# Patient Record
Sex: Female | Born: 1955 | Race: White | Hispanic: No | Smoking: Former smoker
Health system: Southern US, Community
[De-identification: ages and names within clinical notes are randomized; demographics above are authoritative.]

## PROBLEM LIST (undated history)

## (undated) DIAGNOSIS — N200 Calculus of kidney: Secondary | ICD-10-CM

## (undated) DIAGNOSIS — H9192 Unspecified hearing loss, left ear: Secondary | ICD-10-CM

## (undated) DIAGNOSIS — N189 Chronic kidney disease, unspecified: Secondary | ICD-10-CM

## (undated) DIAGNOSIS — K449 Diaphragmatic hernia without obstruction or gangrene: Secondary | ICD-10-CM

## (undated) DIAGNOSIS — R011 Cardiac murmur, unspecified: Secondary | ICD-10-CM

## (undated) DIAGNOSIS — J40 Bronchitis, not specified as acute or chronic: Secondary | ICD-10-CM

## (undated) DIAGNOSIS — R51 Headache: Secondary | ICD-10-CM

## (undated) HISTORY — PX: TUBAL LIGATION: SHX77

## (undated) HISTORY — PX: COLONOSCOPY: SHX174

## (undated) HISTORY — PX: DIAGNOSTIC LAPAROSCOPY: SUR761

## (undated) HISTORY — PX: CARDIAC VALVE SURGERY: SHX40

## (undated) HISTORY — PX: EXTRACORPOREAL SHOCK WAVE LITHOTRIPSY: SHX1557

## (undated) HISTORY — DX: Calculus of kidney: N20.0

## (undated) HISTORY — PX: UPPER GASTROINTESTINAL ENDOSCOPY: SHX188

## (undated) HISTORY — PX: OTHER SURGICAL HISTORY: SHX169

## (undated) HISTORY — PX: APPENDECTOMY: SHX54

---

## 2002-10-22 ENCOUNTER — Other Ambulatory Visit: Admission: RE | Admit: 2002-10-22 | Discharge: 2002-10-22 | Payer: Self-pay | Admitting: *Deleted

## 2011-07-10 HISTORY — PX: DILATION AND CURETTAGE OF UTERUS: SHX78

## 2011-07-30 NOTE — H&P (Signed)
NAME:  Lisa Patton, Lisa Patton NO.:  [id number]  MEDICAL RECORD NO.:  [id number]  LOCATION:                                 FACILITY:  PHYSICIAN:  Juluis Mire, M.D.   DATE OF BIRTH:  15-Jan-1956  DATE OF ADMISSION: DATE OF DISCHARGE:                             HISTORY & PHYSICAL   The patient is a 55 year old postmenopausal patient who presents for hysteroscopy.  The patient is referred to our office for management of symptomatic pelvic relaxation.  She reported episodes of postmenopausal bleeding. We did a saline infusion ultrasound revealing a large endometrial mass or polyp.  She now presents for hysteroscopic resection prior to proceeding with any further definitive surgery for pelvic relaxation.  In terms of allergies, she is allergic to CODEINE.  She is on no medications by her report.  PAST MEDICAL HISTORY:  Usual childhood diseases.  No significant sequelae.  She has had no surgical history.  She has had 2 vaginal deliveries.  SOCIAL HISTORY:  No tobacco or alcohol use.  FAMILY HISTORY:  Noncontributory.  REVIEW OF SYSTEMS:  Noncontributory.  PHYSICAL EXAMINATION:  GENERAL:  The patient is afebrile. VITAL SIGNS:  Stable vital signs. HEENT:  The patient is normocephalic.  Pupils are equal, round, and reactive to light and accommodation.  Extraocular movements are intact. Sclerae and conjunctivae are clear.  Oropharynx is clear. NECK:  Not examined. BREASTS:  Not examined. LUNGS:  Clear. CARDIOVASCULAR:  Regular rhythm and rate without murmurs or gallops. ABDOMEN:  Benign.  No mass, organomegaly, or tenderness. PELVIC:  Normal external genitalia.  Vaginal mucosa, a prominent cystocele with uterine descensus and associated rectocele.  Uterus normal size and shape.  Adnexa unremarkable. EXTREMITIES:  Trace edema, NEUROLOGIC:  Grossly within normal limits.  IMPRESSION:  Postmenopausal bleeding with large endometrial polyp.  PLAN:  The patient will  undergo hysteroscopic evaluation with resection of endometrial polyp.  The nature of procedure has been discussed and the risks explained.  Risks include a risk of infection.  Risk of hemorrhage that could require transfusion with the risk of AIDS or hepatitis.  Excessive bleeding could require hysterectomy.  There is a risk of perforation leading to injury to adjacent organs requiring further exploratory surgery.  Risk of deep venous thrombosis and pulmonary embolus.  The patient expressed understanding of the indications and risks.     Juluis Mire, M.D.     JSM/MEDQ  D:  07/30/2011  T:  07/30/2011  Job:  [id number]

## 2011-07-30 NOTE — H&P (Signed)
  Patient name Lisa Patton DICTATION# [id number] CSN# [id number]  Juluis Mire, MD 07/30/2011 8:38 AM

## 2011-08-02 ENCOUNTER — Encounter (HOSPITAL_COMMUNITY): Payer: Self-pay | Admitting: *Deleted

## 2011-08-02 ENCOUNTER — Ambulatory Visit (HOSPITAL_COMMUNITY)
Admission: RE | Admit: 2011-08-02 | Discharge: 2011-08-02 | Disposition: A | Discharge status: Home / Self Care | Payer: PRIVATE HEALTH INSURANCE | Source: Ambulatory Visit | Attending: Obstetrics and Gynecology | Admitting: Obstetrics and Gynecology

## 2011-08-02 ENCOUNTER — Ambulatory Visit (HOSPITAL_COMMUNITY): Payer: PRIVATE HEALTH INSURANCE | Admitting: Anesthesiology

## 2011-08-02 ENCOUNTER — Encounter (HOSPITAL_COMMUNITY): Payer: Self-pay | Admitting: Anesthesiology

## 2011-08-02 ENCOUNTER — Other Ambulatory Visit: Payer: Self-pay | Admitting: Obstetrics and Gynecology

## 2011-08-02 ENCOUNTER — Encounter (HOSPITAL_COMMUNITY)
Admission: RE | Disposition: A | Discharge status: Home / Self Care | Payer: Self-pay | Source: Ambulatory Visit | Attending: Obstetrics and Gynecology

## 2011-08-02 DIAGNOSIS — N840 Polyp of corpus uteri: Secondary | ICD-10-CM | POA: Insufficient documentation

## 2011-08-02 HISTORY — DX: Cardiac murmur, unspecified: R01.1

## 2011-08-02 HISTORY — DX: Headache: R51

## 2011-08-02 HISTORY — DX: Unspecified hearing loss, left ear: H91.92

## 2011-08-02 LAB — CBC
HCT: 46.9 % — ABNORMAL HIGH (ref 36.0–46.0)
MCV: 91.2 fL (ref 78.0–100.0)
RBC: 5.14 MIL/uL — ABNORMAL HIGH (ref 3.87–5.11)
RDW: 13.8 % (ref 11.5–15.5)
WBC: 6.2 10*3/uL (ref 4.0–10.5)

## 2011-08-02 SURGERY — DILATATION & CURETTAGE/HYSTEROSCOPY WITH RESECTOCOPE
Anesthesia: Choice

## 2011-08-02 MED ORDER — NAPROXEN 500 MG PO TABS: 500.0000 mg | ORAL_TABLET | Freq: Three times a day (TID) | ORAL | Status: DC | PRN

## 2011-08-02 MED ORDER — KETOROLAC TROMETHAMINE 30 MG/ML IJ SOLN
INTRAMUSCULAR | Status: DC | PRN
  Administered 2011-08-02: 15 mg via INTRAVENOUS

## 2011-08-02 MED ORDER — LIDOCAINE-EPINEPHRINE (PF) 1 %-1:200000 IJ SOLN
INTRAMUSCULAR | Status: DC | PRN
  Administered 2011-08-02: 10 mL

## 2011-08-02 MED ORDER — MIDAZOLAM HCL 5 MG/5ML IJ SOLN
INTRAMUSCULAR | Status: DC | PRN
  Administered 2011-08-02: 1 mg via INTRAVENOUS

## 2011-08-02 MED ORDER — FENTANYL CITRATE 0.05 MG/ML IJ SOLN: 25.0000 ug | INTRAMUSCULAR | Status: DC | PRN

## 2011-08-02 MED ORDER — FENTANYL CITRATE 0.05 MG/ML IJ SOLN
INTRAMUSCULAR | Status: DC | PRN
  Administered 2011-08-02: 50 ug via INTRAVENOUS

## 2011-08-02 MED ORDER — MIDAZOLAM HCL 2 MG/2ML IJ SOLN
INTRAMUSCULAR | Status: AC
  Filled 2011-08-02: qty 2

## 2011-08-02 MED ORDER — ONDANSETRON HCL 4 MG/2ML IJ SOLN
INTRAMUSCULAR | Status: AC
  Filled 2011-08-02: qty 2

## 2011-08-02 MED ORDER — PROPOFOL 10 MG/ML IV EMUL
INTRAVENOUS | Status: DC | PRN
  Administered 2011-08-02: 150 mg via INTRAVENOUS

## 2011-08-02 MED ORDER — DEXAMETHASONE SODIUM PHOSPHATE 10 MG/ML IJ SOLN
INTRAMUSCULAR | Status: AC
  Filled 2011-08-02: qty 1

## 2011-08-02 MED ORDER — EPHEDRINE 5 MG/ML INJ
INTRAVENOUS | Status: AC
  Filled 2011-08-02: qty 10

## 2011-08-02 MED ORDER — EPHEDRINE SULFATE 50 MG/ML IJ SOLN
INTRAMUSCULAR | Status: DC | PRN
  Administered 2011-08-02: 10 mg via INTRAVENOUS

## 2011-08-02 MED ORDER — PANTOPRAZOLE SODIUM 40 MG PO TBEC
DELAYED_RELEASE_TABLET | ORAL | Status: AC
  Administered 2011-08-02: 40 mg via ORAL
  Filled 2011-08-02: qty 1

## 2011-08-02 MED ORDER — DEXAMETHASONE SODIUM PHOSPHATE 10 MG/ML IJ SOLN
INTRAMUSCULAR | Status: DC | PRN
  Administered 2011-08-02: 10 mg via INTRAVENOUS

## 2011-08-02 MED ORDER — LIDOCAINE HCL (CARDIAC) 20 MG/ML IV SOLN
INTRAVENOUS | Status: DC | PRN
  Administered 2011-08-02: 50 mg via INTRAVENOUS

## 2011-08-02 MED ORDER — KETOROLAC TROMETHAMINE 60 MG/2ML IM SOLN
INTRAMUSCULAR | Status: DC | PRN
  Administered 2011-08-02: 15 mg via INTRAMUSCULAR

## 2011-08-02 MED ORDER — ONDANSETRON HCL 4 MG/2ML IJ SOLN
INTRAMUSCULAR | Status: DC | PRN
  Administered 2011-08-02: 4 mg via INTRAVENOUS

## 2011-08-02 MED ORDER — GLYCINE 1.5 % IR SOLN
Status: DC | PRN
  Administered 2011-08-02: 3000 mL

## 2011-08-02 MED ORDER — PROPOFOL 10 MG/ML IV EMUL
INTRAVENOUS | Status: AC
  Filled 2011-08-02: qty 20

## 2011-08-02 MED ORDER — LIDOCAINE HCL (CARDIAC) 20 MG/ML IV SOLN
INTRAVENOUS | Status: AC
  Filled 2011-08-02: qty 5

## 2011-08-02 MED ORDER — FENTANYL CITRATE 0.05 MG/ML IJ SOLN
INTRAMUSCULAR | Status: AC
  Filled 2011-08-02: qty 2

## 2011-08-02 MED ORDER — LACTATED RINGERS IV SOLN
INTRAVENOUS | Status: DC
  Administered 2011-08-02 (×2): via INTRAVENOUS

## 2011-08-02 MED ORDER — KETOROLAC TROMETHAMINE 30 MG/ML IJ SOLN
INTRAMUSCULAR | Status: AC
  Filled 2011-08-02: qty 1

## 2011-08-02 SURGICAL SUPPLY — 15 items
CANISTER SUCTION 2500CC (MISCELLANEOUS) ×2 IMPLANT
CATH ROBINSON RED A/P 16FR (CATHETERS) ×2 IMPLANT
CLOTH BEACON ORANGE TIMEOUT ST (SAFETY) ×2 IMPLANT
CONTAINER PREFILL 10% NBF 60ML (FORM) ×4 IMPLANT
DRAPE UTILITY XL STRL (DRAPES) ×4 IMPLANT
ELECT REM PT RETURN 9FT ADLT (ELECTROSURGICAL) ×2
ELECTRODE REM PT RTRN 9FT ADLT (ELECTROSURGICAL) ×1 IMPLANT
GLOVE BIO SURGEON STRL SZ7 (GLOVE) ×4 IMPLANT
GOWN PREVENTION PLUS LG XLONG (DISPOSABLE) ×2 IMPLANT
GOWN STRL REIN XL XLG (GOWN DISPOSABLE) ×2 IMPLANT
LOOP ANGLED CUTTING 22FR (CUTTING LOOP) ×2 IMPLANT
NEEDLE SPNL 20GX3.5 QUINCKE YW (NEEDLE) ×2 IMPLANT
PACK HYSTEROSCOPY LF (CUSTOM PROCEDURE TRAY) ×2 IMPLANT
TOWEL OR 17X24 6PK STRL BLUE (TOWEL DISPOSABLE) ×4 IMPLANT
WATER STERILE IRR 1000ML POUR (IV SOLUTION) ×2 IMPLANT

## 2011-08-02 NOTE — Anesthesia Postprocedure Evaluation (Signed)
  Anesthesia Post-op Note  Patient: Lisa Patton  Procedure(s) Performed:  DILATATION & CURETTAGE/HYSTEROSCOPY WITH RESECTOSCOPE  Patient Location: PACU  Anesthesia Type: General  Level of Consciousness: awake, alert  and oriented  Airway and Oxygen Therapy: Patient Spontanous Breathing  Post-op Pain: none  Post-op Assessment: Post-op Vital signs reviewed, Patient's Cardiovascular Status Stable, Respiratory Function Stable, Patent Airway and Pain level controlled  Post-op Vital Signs: Reviewed and stable  Complications: No apparent anesthesia complications

## 2011-08-02 NOTE — Progress Notes (Signed)
  Patient name  Lisa Patton DICTATION# [id number] CSN#  [id number]  Juluis Mire, MD 08/02/2011 8:04 AM

## 2011-08-02 NOTE — Anesthesia Procedure Notes (Signed)
Procedure Name: LMA Insertion Date/Time: 08/02/2011 7:38 AM Performed by: Cephus Shelling Pre-anesthesia Checklist: Patient identified, Patient being monitored, Emergency Drugs available and Suction available Patient Re-evaluated:Patient Re-evaluated prior to inductionOxygen Delivery Method: Circle System Utilized Preoxygenation: Pre-oxygenation with 100% oxygen Intubation Type: Combination inhalational/ intravenous induction Ventilation: Mask ventilation without difficulty LMA: LMA inserted LMA Size: 3.0 Number of attempts: 1 Placement Confirmation: positive ETCO2 and breath sounds checked- equal and bilateral Dental Injury: Teeth and Oropharynx as per pre-operative assessment

## 2011-08-02 NOTE — Brief Op Note (Signed)
08/02/2011  8:01 AM  PATIENT:  Lisa Patton  55 y.o. female  PRE-OPERATIVE DIAGNOSIS:  polyp  POST-OPERATIVE DIAGNOSIS:  polyp  PROCEDURE:  Procedure(s): DILATATION & CURETTAGE/HYSTEROSCOPY WITH RESECTOSCOPE  SURGEON:  Surgeon(s): Preesha Benjamin S Terik Haughey  PHYSICIAN ASSISTANT:   ASSISTANTS: none   ANESTHESIA:   general and paracervical block  OR FLUID I/O:  Total I/O In: 1000 [I.V.:1000] Out: 5 [Blood:5]  BLOOD ADMINISTERED:none  DRAINS: none   LOCAL MEDICATIONS USED:  XYLOCAINE 15CC  SPECIMEN:  Source of Specimen:  polyp and endometrial currettings  DISPOSITION OF SPECIMEN:  PATHOLOGY  COUNTS:  YES  TOURNIQUET:  * No tourniquets in log *  DICTATION: .Other Dictation: Dictation Number [id number]  PLAN OF CARE: Discharge to home after PACU  PATIENT DISPOSITION:  PACU - hemodynamically stable.   Delay start of Pharmacological VTE agent (>24hrs) due to surgical blood loss or risk of bleeding:  no

## 2011-08-02 NOTE — H&P (Signed)
Hand P unchanged

## 2011-08-02 NOTE — Transfer of Care (Signed)
Immediate Anesthesia Transfer of Care Note  Patient: Lisa Patton  Procedure(s) Performed:  DILATATION & CURETTAGE/HYSTEROSCOPY WITH RESECTOSCOPE  Patient Location: PACU  Anesthesia Type: General  Level of Consciousness: awake, sedated, patient cooperative and responds to stimulation  Airway & Oxygen Therapy: Patient Spontanous Breathing and Patient connected to nasal cannula oxygen  Post-op Assessment: Report given to PACU RN and Post -op Vital signs reviewed and stable  Post vital signs: Reviewed and stable  Complications: No apparent anesthesia complications

## 2011-08-02 NOTE — Anesthesia Preprocedure Evaluation (Addendum)
Anesthesia Evaluation  Name, MR# and DOB Patient awake  General Assessment Comment  Reviewed: Allergy & Precautions, H&P , NPO status , Patient's Chart, lab work & pertinent test results, reviewed documented beta blocker date and time   History of Anesthesia Complications Negative for: history of anesthetic complications  Airway Mallampati: II TM Distance: >3 FB Neck ROM: full    Dental  (+) Missing   Pulmonary  clear to auscultation  breath sounds clear to auscultation none    Cardiovascular Exercise Tolerance: Good regular Normal    Neuro/Psych   Headaches   Very HOH Negative Psych ROS  GI/Hepatic/Renal negative GI ROS  negative Liver ROS  negative Renal ROS        Endo/Other  Negative Endocrine ROS (+)      Abdominal   Musculoskeletal   Hematology negative hematology ROS (+)   Peds  Reproductive/Obstetrics    Anesthesia Other Findings 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           Anesthesia Physical Anesthesia Plan  ASA: II  Anesthesia Plan: General   Post-op Pain Management:    Induction:   Airway Management Planned:   Additional Equipment:   Intra-op Plan:   Post-operative Plan:   Informed Consent: I have reviewed the patients History and Physical, chart, labs and discussed the procedure including the risks, benefits and alternatives for the proposed anesthesia with the patient or authorized representative who has indicated his/her understanding and acceptance.     Plan Discussed with: CRNA and Surgeon  Anesthesia Plan Comments:         Anesthesia Quick Evaluation

## 2011-08-02 NOTE — Op Note (Signed)
[name], Lisa Patton  MEDICAL RECORD NO.:  [id number]  LOCATION:  [facility]                          FACILITY:  [facility]  PHYSICIAN:  Juluis Mire, M.D.   DATE OF BIRTH:  August 26, 1956  DATE OF PROCEDURE:  08/02/2011 DATE OF DISCHARGE:                              OPERATIVE REPORT   PREOPERATIVE DIAGNOSIS:  Endometrial polyp.  POSTOPERATIVE DIAGNOSIS:  Endometrial polyp.  PROCEDURE:  Paracervical block.  Cervical dilatation with hysteroscopy and resection of endometrial polyp.  Endometrial curettings.  SURGEON:  Juluis Mire, MD.  ANESTHESIA:  General with paracervical block.  ESTIMATED BLOOD LOSS:  Minimal.  PACKS AND DRAINS:  Were none.  INTRAOPERATIVE BLOOD REPLACEMENT:  None.  COMPLICATIONS:  None.  INDICATIONS:  Dictated in dictated history and physical.  PROCEDURE IN DETAIL:  The patient was taken to OR, placed in supine position.  After satisfactory level of general anesthesia was obtained, the patient was placed in the dorsal lithotomy position using the Allen stirrups.  Perineum and vagina prepped out with Betadine and draped sterile field.  Examination revealed marked uterine descensus with a severe cystocele.  Cervix was grasped with single-tooth tenaculum. Uterus sounded to approximately 9 cm.  Paracervical blocks was entered using 1% Xylocaine With Epinephrine.  Cervix was serially dilated to a size 31 Pratt dilator.  Hysteroscope was introduced and intrauterine cavity was distended using glycerol.  A large posterior wall polyp was identified and was resected and was sent for pathology.  Then the resection total deficit was 50 mL.  Endometrial curettings were then obtained and sent for pathological review.  Polyps had been incompletely resected.  There were no signs of perforation or complications.  There was no active bleeding.  At this point in time, the hysteroscope, single-tooth tenaculum and speculum were removed.  The  patient was taken out of the dorsal lithotomy position.  Once alert transferred to the recovery room in good condition.  Sponge, instrument, and needle count was correct per circulating nurse.     Juluis Mire, M.D.     JSM/MEDQ  D:  08/02/2011  T:  08/02/2011  Job:  [id number]

## 2011-10-02 ENCOUNTER — Encounter (HOSPITAL_COMMUNITY): Payer: Self-pay

## 2011-10-07 ENCOUNTER — Encounter (HOSPITAL_COMMUNITY)
Admission: RE | Admit: 2011-10-07 | Discharge: 2011-10-07 | Disposition: A | Discharge status: Home / Self Care | Payer: PRIVATE HEALTH INSURANCE | Source: Ambulatory Visit | Attending: Obstetrics and Gynecology | Admitting: Obstetrics and Gynecology

## 2011-10-07 ENCOUNTER — Other Ambulatory Visit: Payer: Self-pay

## 2011-10-07 ENCOUNTER — Encounter (HOSPITAL_COMMUNITY): Payer: Self-pay

## 2011-10-07 HISTORY — DX: Bronchitis, not specified as acute or chronic: J40

## 2011-10-07 HISTORY — DX: Diaphragmatic hernia without obstruction or gangrene: K44.9

## 2011-10-07 HISTORY — DX: Chronic kidney disease, unspecified: N18.9

## 2011-10-07 LAB — CBC
HCT: 42.1 % (ref 36.0–46.0)
MCHC: 33.3 g/dL (ref 30.0–36.0)
MCV: 90.7 fL (ref 78.0–100.0)
RDW: 13.6 % (ref 11.5–15.5)

## 2011-10-07 NOTE — Patient Instructions (Addendum)
   Your procedure is scheduled on:  Wednesday, Dec 5th  Enter through the [employer] [facility] at: 6:00am Pick up the phone at the desk and dial [phone] and inform us of your arrival.  Please call this number if you have any problems the morning of surgery: [phone]  Remember: Do not eat food after midnight: Tuesday Do not drink clear liquids after:  Tuesday Take these medicines the morning of surgery with a SIP OF WATER: None  Do not wear jewelry, make-up, or FINGER nail polish Do not wear lotions, powders, perfumes or deodorant. Do not shave 48 hours prior to surgery. Do not bring valuables to the hospital.  Leave suitcase in the car. After Surgery it may be brought to your room. For patients being admitted to the hospital, checkout time is 11:00am the day of discharge.  Home with husband  "Lisa Patton" or daughter Lisa Patton.  Remember to use your hibiclens as instructed.Please shower with 1/2 bottle the evening before your surgery and the other 1/2 bottle the morning of surgery.

## 2011-10-07 NOTE — Pre-Procedure Instructions (Signed)
OK to see patient DOS 

## 2011-10-08 NOTE — H&P (Signed)
  Patient name  Lisa Patton DICTATION#  [id number] CSN# [id number]  Juluis Mire, MD 10/08/2011 5:46 AM

## 2011-10-08 NOTE — H&P (Signed)
NAMEEMMACLAIRE, SWITALA NO.:  [id number]  MEDICAL RECORD NO.:  [id number]  LOCATION:  [facility]                           [facility]ACILITY:  [facility]  PHYSICIAN:  Juluis Mire, M.D.   DATE OF BIRTH:  1956-03-02  DATE OF ADMISSION:  10/07/2011 DATE OF DISCHARGE:  10/07/2011                             HISTORY & PHYSICAL   DATE OF SURGERY:  October 13, 2011.  This is done at [facility].  The patient is a 55 year old postmenopausal patient, who presents for laparoscopic-assisted vaginal hysterectomy, bilateral salpingo- oophorectomy, anterior and posterior repair, possible sacrospinous ligament suspension.  In relation to present admission, the patient was referred to our practice for evaluation of pelvic relaxation.  On evaluation, she had a bladder prolapsing out of the introitus along with moderate uterine descensus.  Bimanual exam was unremarkable.  She had reported some postmenopausal bleeding.  Underwent a saline infusion ultrasound with finding of endometrial polyp.  Subsequently had a hysteroscopic resection.  Polyp turned out to be benign.  Now presents for definitive surgery as noted above.  Other alternatives such as pessary use have been discussed.  We did do urodynamic testing in the office.  We could not demonstrate any significant leakage even with the use of pessary, therefore mid urethral sling will not be done.  In terms of allergies, allergic to CODEINE.  MEDICATIONS:  None.  PAST MEDICAL HISTORY:  Usual childhood diseases.  No significant sequelae.  No previous surgical history except for the above-noted hysteroscopy.  She has had 2 vaginal deliveries.  SOCIAL HISTORY:  No tobacco or alcohol use.  FAMILY HISTORY:  Noncontributory.  PHYSICAL EXAMINATION:  VITAL SIGNS:  The patient is afebrile with stable vital signs. HEENT:  The patient is normocephalic.  Pupils equal, round, and reactive to light and accommodation.  Extraocular movements were  intact.  Sclerae and conjunctivae are clear.  Oropharynx clear. NECK:  Without thyromegaly. BREASTS:  Not examined. LUNGS:  Clear. CARDIOVASCULAR SYSTEM:  Regular rhythm and rate without murmurs or gallops. ABDOMEN:  Benign.  No mass, organomegaly, or tenderness. PELVIC:  Normal external genitalia and vaginal mucosa.  Severe cystocele with associated uterine descensus.  Uterus normal size, shape, and contour.  Adnexa free of mass or tenderness. EXTREMITIES:  Trace edema. NEUROLOGIC:  Grossly within normal limits.  IMPRESSION:  Symptomatic pelvic relaxation.  PLAN:  The patient will undergo laparoscopic-assisted vaginal hysterectomy, bilateral salpingo-oophorectomy.  We will proceed with anterior and posterior repair with possible sacrospinous ligament suspension.  Risks of surgery have been discussed including the risk of infection.  Risk of hemorrhage that could require transfusion with the risk of AIDS or hepatitis.  Risk of injury to adjacent organs including bladder, bowel, ureters that could require further exploratory surgery. Risks of deep venous thrombosis and pulmonary embolus.  Potential pudendal nerve injury have been discussed.  Symptoms including buttocks or perineal pain and numbness have been described.  Possible removal of suture would be indicated at that point in time.  Recurrence rate for pelvic relaxation of 25% are quoted.  Other alternatives such as pessary explained.  The patient expressed understanding of the indications and risks.     Juluis Mire, M.D.  JSM/MEDQ  D:  10/08/2011  T:  10/08/2011  Job:  [id number]

## 2011-10-12 MED ORDER — CEFAZOLIN SODIUM-DEXTROSE 2-3 GM-% IV SOLR
2.0000 g | INTRAVENOUS | Status: AC
  Administered 2011-10-13: 2 g via INTRAVENOUS
  Filled 2011-10-12: qty 50

## 2011-10-13 ENCOUNTER — Ambulatory Visit (HOSPITAL_COMMUNITY)
Admission: RE | Admit: 2011-10-13 | Discharge: 2011-10-15 | Disposition: A | Discharge status: Home / Self Care | Payer: PRIVATE HEALTH INSURANCE | Source: Ambulatory Visit | Attending: Obstetrics and Gynecology | Admitting: Obstetrics and Gynecology

## 2011-10-13 ENCOUNTER — Other Ambulatory Visit: Payer: Self-pay | Admitting: Obstetrics and Gynecology

## 2011-10-13 ENCOUNTER — Encounter (HOSPITAL_COMMUNITY): Payer: Self-pay | Admitting: Anesthesiology

## 2011-10-13 ENCOUNTER — Encounter (HOSPITAL_COMMUNITY): Payer: Self-pay | Admitting: *Deleted

## 2011-10-13 ENCOUNTER — Encounter (HOSPITAL_COMMUNITY)
Admission: RE | Disposition: A | Discharge status: Home / Self Care | Payer: Self-pay | Source: Ambulatory Visit | Attending: Obstetrics and Gynecology

## 2011-10-13 ENCOUNTER — Ambulatory Visit (HOSPITAL_COMMUNITY): Payer: PRIVATE HEALTH INSURANCE | Admitting: Anesthesiology

## 2011-10-13 DIAGNOSIS — N8000 Endometriosis of the uterus, unspecified: Secondary | ICD-10-CM | POA: Insufficient documentation

## 2011-10-13 DIAGNOSIS — D251 Intramural leiomyoma of uterus: Secondary | ICD-10-CM | POA: Insufficient documentation

## 2011-10-13 DIAGNOSIS — Z90710 Acquired absence of both cervix and uterus: Secondary | ICD-10-CM

## 2011-10-13 DIAGNOSIS — N812 Incomplete uterovaginal prolapse: Secondary | ICD-10-CM | POA: Insufficient documentation

## 2011-10-13 DIAGNOSIS — Z01818 Encounter for other preprocedural examination: Secondary | ICD-10-CM | POA: Insufficient documentation

## 2011-10-13 DIAGNOSIS — Z01812 Encounter for preprocedural laboratory examination: Secondary | ICD-10-CM | POA: Insufficient documentation

## 2011-10-13 DIAGNOSIS — N9489 Other specified conditions associated with female genital organs and menstrual cycle: Secondary | ICD-10-CM | POA: Insufficient documentation

## 2011-10-13 DIAGNOSIS — N838 Other noninflammatory disorders of ovary, fallopian tube and broad ligament: Secondary | ICD-10-CM | POA: Insufficient documentation

## 2011-10-13 HISTORY — PX: CYSTOSCOPY: SHX5120

## 2011-10-13 HISTORY — PX: SALPINGOOPHORECTOMY: SHX82

## 2011-10-13 HISTORY — PX: LAPAROSCOPIC ASSISTED VAGINAL HYSTERECTOMY: SHX5398

## 2011-10-13 HISTORY — PX: ANTERIOR AND POSTERIOR REPAIR: SHX5121

## 2011-10-13 LAB — COMPREHENSIVE METABOLIC PANEL
CO2: 29 mEq/L (ref 19–32)
Calcium: 10 mg/dL (ref 8.4–10.5)
Creatinine, Ser: 0.78 mg/dL (ref 0.50–1.10)
GFR calc Af Amer: >90 mL/min (ref >90)
GFR calc non Af Amer: >90 mL/min (ref >90)
Glucose, Bld: 97 mg/dL (ref 70–99)

## 2011-10-13 SURGERY — HYSTERECTOMY, VAGINAL, LAPAROSCOPY-ASSISTED
Anesthesia: General | Site: Vagina | Wound class: Clean Contaminated

## 2011-10-13 MED ORDER — HYDROMORPHONE 0.3 MG/ML IV SOLN
INTRAVENOUS | Status: DC
  Administered 2011-10-13: 7.5 mg via INTRAVENOUS
  Administered 2011-10-13: 0.2 mg via INTRAVENOUS
  Administered 2011-10-13: 0.39 mg via INTRAVENOUS
  Administered 2011-10-13 – 2011-10-14 (×3): 0.2 mg via INTRAVENOUS
  Filled 2011-10-13: qty 25

## 2011-10-13 MED ORDER — FENTANYL CITRATE 0.05 MG/ML IJ SOLN
INTRAMUSCULAR | Status: AC
  Filled 2011-10-13: qty 5

## 2011-10-13 MED ORDER — HYDROMORPHONE HCL PF 1 MG/ML IJ SOLN: 0.2500 mg | INTRAMUSCULAR | Status: DC | PRN

## 2011-10-13 MED ORDER — GLYCOPYRROLATE 0.2 MG/ML IJ SOLN
INTRAMUSCULAR | Status: DC | PRN
  Administered 2011-10-13 (×2): 0.2 mg via INTRAVENOUS
  Administered 2011-10-13: 1 mg via INTRAVENOUS

## 2011-10-13 MED ORDER — FENTANYL CITRATE 0.05 MG/ML IJ SOLN
INTRAMUSCULAR | Status: DC | PRN
  Administered 2011-10-13 (×6): 50 ug via INTRAVENOUS

## 2011-10-13 MED ORDER — NEOSTIGMINE METHYLSULFATE 1 MG/ML IJ SOLN
INTRAMUSCULAR | Status: AC
  Filled 2011-10-13: qty 10

## 2011-10-13 MED ORDER — FENTANYL CITRATE 0.05 MG/ML IJ SOLN
INTRAMUSCULAR | Status: AC
  Administered 2011-10-13: 50 ug via INTRAVENOUS
  Filled 2011-10-13: qty 2

## 2011-10-13 MED ORDER — DEXAMETHASONE SODIUM PHOSPHATE 4 MG/ML IJ SOLN
INTRAMUSCULAR | Status: DC | PRN
  Administered 2011-10-13: 10 mg via INTRAVENOUS

## 2011-10-13 MED ORDER — ONDANSETRON HCL 4 MG/2ML IJ SOLN
INTRAMUSCULAR | Status: AC
  Filled 2011-10-13: qty 2

## 2011-10-13 MED ORDER — LIDOCAINE-EPINEPHRINE (PF) 1 %-1:200000 IJ SOLN
INTRAMUSCULAR | Status: DC | PRN
  Administered 2011-10-13: 30 mL

## 2011-10-13 MED ORDER — NEOSTIGMINE METHYLSULFATE 1 MG/ML IJ SOLN
INTRAMUSCULAR | Status: DC | PRN
  Administered 2011-10-13: 5 mg via INTRAVENOUS

## 2011-10-13 MED ORDER — LIDOCAINE HCL (CARDIAC) 20 MG/ML IV SOLN
INTRAVENOUS | Status: DC | PRN
  Administered 2011-10-13: 40 mg via INTRAVENOUS

## 2011-10-13 MED ORDER — LACTATED RINGERS IV SOLN
INTRAVENOUS | Status: DC
  Administered 2011-10-13 – 2011-10-14 (×4): via INTRAVENOUS

## 2011-10-13 MED ORDER — PROPOFOL 10 MG/ML IV EMUL
INTRAVENOUS | Status: AC
  Filled 2011-10-13: qty 20

## 2011-10-13 MED ORDER — KETOROLAC TROMETHAMINE 30 MG/ML IJ SOLN
INTRAMUSCULAR | Status: AC
  Filled 2011-10-13: qty 1

## 2011-10-13 MED ORDER — DIPHENHYDRAMINE HCL 50 MG/ML IJ SOLN: 12.5000 mg | Freq: Four times a day (QID) | INTRAMUSCULAR | Status: DC | PRN

## 2011-10-13 MED ORDER — DEXAMETHASONE SODIUM PHOSPHATE 10 MG/ML IJ SOLN
INTRAMUSCULAR | Status: AC
  Filled 2011-10-13: qty 1

## 2011-10-13 MED ORDER — INDIGOTINDISULFONATE SODIUM 8 MG/ML IJ SOLN
5.0000 mL | Freq: Once | INTRAMUSCULAR | Status: AC
  Administered 2011-10-13: 5 mL via INTRAVENOUS
  Filled 2011-10-13: qty 5

## 2011-10-13 MED ORDER — ROCURONIUM BROMIDE 100 MG/10ML IV SOLN
INTRAVENOUS | Status: DC | PRN
  Administered 2011-10-13: 30 mg via INTRAVENOUS
  Administered 2011-10-13: 10 mg via INTRAVENOUS

## 2011-10-13 MED ORDER — ONDANSETRON HCL 4 MG/2ML IJ SOLN
INTRAMUSCULAR | Status: DC | PRN
  Administered 2011-10-13: 4 mg via INTRAVENOUS

## 2011-10-13 MED ORDER — BUPIVACAINE HCL (PF) 0.25 % IJ SOLN
INTRAMUSCULAR | Status: DC | PRN
  Administered 2011-10-13: 8 mL

## 2011-10-13 MED ORDER — NALOXONE HCL 0.4 MG/ML IJ SOLN: 0.4000 mg | INTRAMUSCULAR | Status: DC | PRN

## 2011-10-13 MED ORDER — KETOROLAC TROMETHAMINE 30 MG/ML IJ SOLN
INTRAMUSCULAR | Status: DC | PRN
  Administered 2011-10-13: 30 mg via INTRAVENOUS

## 2011-10-13 MED ORDER — GLYCOPYRROLATE 0.2 MG/ML IJ SOLN
INTRAMUSCULAR | Status: AC
  Filled 2011-10-13: qty 1

## 2011-10-13 MED ORDER — PROPOFOL 10 MG/ML IV EMUL
INTRAVENOUS | Status: AC
  Filled 2011-10-13: qty 50

## 2011-10-13 MED ORDER — ROCURONIUM BROMIDE 50 MG/5ML IV SOLN
INTRAVENOUS | Status: AC
  Filled 2011-10-13: qty 1

## 2011-10-13 MED ORDER — STERILE WATER FOR IRRIGATION IR SOLN
Status: DC | PRN
  Administered 2011-10-13: 1000 mL

## 2011-10-13 MED ORDER — ONDANSETRON HCL 4 MG/2ML IJ SOLN
4.0000 mg | Freq: Four times a day (QID) | INTRAMUSCULAR | Status: DC | PRN
  Administered 2011-10-14: 4 mg via INTRAVENOUS
  Filled 2011-10-13: qty 2

## 2011-10-13 MED ORDER — PROPOFOL 10 MG/ML IV EMUL
INTRAVENOUS | Status: DC | PRN
  Administered 2011-10-13: 50 mg via INTRAVENOUS
  Administered 2011-10-13: 20 mg via INTRAVENOUS
  Administered 2011-10-13: 150 mg via INTRAVENOUS

## 2011-10-13 MED ORDER — MIDAZOLAM HCL 2 MG/2ML IJ SOLN
INTRAMUSCULAR | Status: AC
  Filled 2011-10-13: qty 2

## 2011-10-13 MED ORDER — MIDAZOLAM HCL 5 MG/5ML IJ SOLN
INTRAMUSCULAR | Status: DC | PRN
  Administered 2011-10-13: 2 mg via INTRAVENOUS

## 2011-10-13 MED ORDER — FENTANYL CITRATE 0.05 MG/ML IJ SOLN
25.0000 ug | INTRAMUSCULAR | Status: DC | PRN
  Administered 2011-10-13: 50 ug via INTRAVENOUS

## 2011-10-13 MED ORDER — SODIUM CHLORIDE 0.9 % IJ SOLN: 9.0000 mL | INTRAMUSCULAR | Status: DC | PRN

## 2011-10-13 MED ORDER — DIPHENHYDRAMINE HCL 12.5 MG/5ML PO ELIX: 12.5000 mg | ORAL_SOLUTION | Freq: Four times a day (QID) | ORAL | Status: DC | PRN

## 2011-10-13 MED ORDER — LIDOCAINE HCL (CARDIAC) 20 MG/ML IV SOLN
INTRAVENOUS | Status: AC
  Filled 2011-10-13: qty 5

## 2011-10-13 MED ORDER — GLYCOPYRROLATE 0.2 MG/ML IJ SOLN
INTRAMUSCULAR | Status: AC
  Filled 2011-10-13: qty 3

## 2011-10-13 SURGICAL SUPPLY — 52 items
BANDAGE GAUZE ELAST BULKY 4 IN (GAUZE/BANDAGES/DRESSINGS) IMPLANT
BLADE SURG 15 STRL LF C SS BP (BLADE) ×5 IMPLANT
BLADE SURG 15 STRL SS (BLADE) ×1
CANISTER SUCTION 2500CC (MISCELLANEOUS) ×6 IMPLANT
CATH ROBINSON RED A/P 16FR (CATHETERS) ×6 IMPLANT
CLOTH BEACON ORANGE TIMEOUT ST (SAFETY) ×6 IMPLANT
CONT PATH 16OZ SNAP LID 3702 (MISCELLANEOUS) ×6 IMPLANT
COVER TABLE BACK 60X90 (DRAPES) ×6 IMPLANT
DECANTER SPIKE VIAL GLASS SM (MISCELLANEOUS) ×6 IMPLANT
DERMABOND ADVANCED (GAUZE/BANDAGES/DRESSINGS) ×1
DERMABOND ADVANCED .7 DNX12 (GAUZE/BANDAGES/DRESSINGS) ×5 IMPLANT
DEVICE CAPIO SUTURING (INSTRUMENTS) ×1
DEVICE CAPIO SUTURING OPC (INSTRUMENTS) ×5 IMPLANT
DRAPE CAMERA CLOSED 9X96 (DRAPES) ×6 IMPLANT
ELECT REM PT RETURN 9FT ADLT (ELECTROSURGICAL) ×6
ELECTRODE REM PT RTRN 9FT ADLT (ELECTROSURGICAL) ×5 IMPLANT
GAUZE KERLIX 2  STERILE LF (GAUZE/BANDAGES/DRESSINGS) ×6 IMPLANT
GAUZE SPONGE 4X4 16PLY XRAY LF (GAUZE/BANDAGES/DRESSINGS) ×6 IMPLANT
GLOVE BIO SURGEON STRL SZ7 (GLOVE) ×18 IMPLANT
GLOVE BIOGEL PI IND STRL 6.5 (GLOVE) ×5 IMPLANT
GLOVE BIOGEL PI INDICATOR 6.5 (GLOVE) ×1
GOWN PREVENTION PLUS LG XLONG (DISPOSABLE) ×18 IMPLANT
GOWN STRL REIN 2XL XLG LVL4 (GOWN DISPOSABLE) ×12 IMPLANT
NEEDLE HYPO 22GX1.5 SAFETY (NEEDLE) ×6 IMPLANT
NEEDLE MAYO 6 CRC TAPER PT (NEEDLE) ×6 IMPLANT
NS IRRIG 1000ML POUR BTL (IV SOLUTION) ×6 IMPLANT
PACK LAVH (CUSTOM PROCEDURE TRAY) ×6 IMPLANT
SEALER TISSUE G2 CVD JAW 45CM (ENDOMECHANICALS) ×6 IMPLANT
SET CYSTO W/LG BORE CLAMP LF (SET/KITS/TRAYS/PACK) ×6 IMPLANT
SET IRRIG TUBING LAPAROSCOPIC (IRRIGATION / IRRIGATOR) ×6 IMPLANT
SUT CAPIO POLYGLYCOLIC (SUTURE) ×6 IMPLANT
SUT MON AB 2-0 CT1 27 (SUTURE) ×6 IMPLANT
SUT MON AB 3-0 SH 27 (SUTURE)
SUT MON AB 3-0 SH27 (SUTURE) IMPLANT
SUT VIC AB 0 CT1 18XCR BRD8 (SUTURE) ×10 IMPLANT
SUT VIC AB 0 CT1 27 (SUTURE) ×1
SUT VIC AB 0 CT1 27XBRD ANBCTR (SUTURE) ×5 IMPLANT
SUT VIC AB 0 CT1 36 (SUTURE) ×6 IMPLANT
SUT VIC AB 0 CT1 8-18 (SUTURE) ×2
SUT VIC AB 2-0 CT1 27 (SUTURE)
SUT VIC AB 2-0 CT1 TAPERPNT 27 (SUTURE) IMPLANT
SUT VIC AB 2-0 CT2 27 (SUTURE) ×24 IMPLANT
SUT VIC AB 2-0 UR6 27 (SUTURE) ×12 IMPLANT
SUT VICRYL 0 UR6 27IN ABS (SUTURE) ×6 IMPLANT
SUT VICRYL 1 TIES 12X18 (SUTURE) ×6 IMPLANT
SUT VICRYL 4-0 PS2 18IN ABS (SUTURE) ×6 IMPLANT
TOWEL OR 17X24 6PK STRL BLUE (TOWEL DISPOSABLE) ×18 IMPLANT
TRAY FOLEY CATH 14FR (SET/KITS/TRAYS/PACK) ×6 IMPLANT
TROCAR BALLN 12MMX100 BLUNT (TROCAR) ×6 IMPLANT
TROCAR Z-THREAD BLADED 5X100MM (TROCAR) ×6 IMPLANT
WARMER LAPAROSCOPE (MISCELLANEOUS) ×6 IMPLANT
WATER STERILE IRR 1000ML POUR (IV SOLUTION) ×6 IMPLANT

## 2011-10-13 NOTE — Brief Op Note (Signed)
10/13/2011  9:27 AM  PATIENT:  Lisa Patton  55 y.o. female  PRE-OPERATIVE DIAGNOSIS:  Pelvic Relaxation, Stress Urinary Incontinence  POST-OPERATIVE DIAGNOSIS:  Pelvic Relaxation, Stress Urinary Incontinence  PROCEDURE:  Procedure(s): LAPAROSCOPIC ASSISTED VAGINAL HYSTERECTOMY SALPINGO OOPHERECTOMY ANTERIOR (CYSTOCELE) AND POSTERIOR REPAIR (RECTOCELE) CYSTOSCOPY  SURGEON:  Surgeon(s): Satvik Parco S Rowe Warman Gretchen Adkins  PHYSICIAN ASSISTANT:   ASSISTANTS: atkins   ANESTHESIA:   general  EBL:  Total I/O In: 1000 [I.V.:1000] Out: 475 [Urine:350; Blood:125]  BLOOD ADMINISTERED:none  DRAINS: Urinary Catheter (Foley)   Vaginal pack  LOCAL MEDICATIONS USED:  XYLOCAINE 10CC  SPECIMEN:  Source of Specimen:  uterus tubes and ovaries  DISPOSITION OF SPECIMEN:  PATHOLOGY  COUNTS:  YES  TOURNIQUET:  * No tourniquets in log *  DICTATION: .Other Dictation: Dictation Number [id number]  PLAN OF CARE: Admit for overnight observation  PATIENT DISPOSITION:  PACU - hemodynamically stable.   Delay start of Pharmacological VTE agent (>24hrs) due to surgical blood loss or risk of bleeding:  {YES/NO/NOT APPLICABLE:20182

## 2011-10-13 NOTE — Progress Notes (Signed)
Patient ID: Lisa Patton, female   DOB: [birth year], 55 y.o.   MRN: [id number] Patient name DICTATION#  [id number] CSN# [id number]   Juluis Mire, MD 10/13/2011 9:29 AM

## 2011-10-13 NOTE — H&P (Signed)
  History and physical exam unchanged 

## 2011-10-13 NOTE — Anesthesia Preprocedure Evaluation (Signed)
Anesthesia Evaluation  Patient identified by MRN, date of birth, ID band Patient awake    Reviewed: Allergy & Precautions, H&P , NPO status , Patient's Chart, lab work & pertinent test results, reviewed documented beta blocker date and time   History of Anesthesia Complications Negative for: history of anesthetic complications  Airway Mallampati: II TM Distance: >3 FB Neck ROM: full    Dental  (+) Missing   Pulmonary neg pulmonary ROS,  clear to auscultation        Cardiovascular Exercise Tolerance: Good + Valvular Problems/Murmurs (history of open heart surgery at 55 yo to correct a valve problem) regular Normal    Neuro/Psych  Headaches, Very HOH Negative Psych ROS   GI/Hepatic negative GI ROS, Neg liver ROS,   Endo/Other  Negative Endocrine ROS  Renal/GU Renal disease (kidney stones)  Genitourinary negative   Musculoskeletal   Abdominal   Peds  Hematology negative hematology ROS (+)   Anesthesia Other Findings 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  Reproductive/Obstetrics                           Anesthesia Physical Anesthesia Plan  ASA: II  Anesthesia Plan: General ETT   Post-op Pain Management:    Induction:   Airway Management Planned:   Additional Equipment:   Intra-op Plan:   Post-operative Plan:   Informed Consent: I have reviewed the patients History and Physical, chart, labs and discussed the procedure including the risks, benefits and alternatives for the proposed anesthesia with the patient or authorized representative who has indicated his/her understanding and acceptance.   Dental Advisory Given  Plan Discussed with: CRNA and Surgeon  Anesthesia Plan Comments:         Anesthesia Quick Evaluation

## 2011-10-13 NOTE — Progress Notes (Signed)
Patient ID: Lisa Patton, female   DOB: [birth year], 55 y.o.   MRN: [id number] S: SOME ABD PAIN AND NAUSEA O: AF VSS      ABD SOFT    INC CLEAR  GOOD UO A: POST OP NORMAL DISCOMFORT P:  CONTINUE POST OP MANAGEMENT

## 2011-10-13 NOTE — Op Note (Signed)
NAMEHEYLI, MIN NO.:  [id number]  MEDICAL RECORD NO.:  [id number]  LOCATION:  [facility]                          FACILITY:  [facility]  PHYSICIAN:  Juluis Mire, M.D.   DATE OF BIRTH:  1956-11-06  DATE OF PROCEDURE:  10/13/2011 DATE OF DISCHARGE:                              OPERATIVE REPORT   PREOPERATIVE DIAGNOSIS:  Symptomatic pelvic relaxation.  POSTOPERATIVE DIAGNOSES:  Symptomatic pelvic relaxation.  OPERATIVE PROCEDURES:  Laparoscopic-assisted vaginal hysterectomy, bilateral salpingo-oophorectomy, anterior colporrhaphy.  Posterior colporrhaphy.  Sacrospinous ligament suspension.  Cystoscopy.  SURGEONS:  Juluis Mire, MD  ASSISTANT:  Zelphia Cairo, MD  ANESTHESIA:  General endotracheal.  ESTIMATED BLOOD LOSS:  200-300 mL.  PACKS:  Vaginal packs.  DRAINS:  Urethral Foley.  INTRAOPERATIVE BLOOD PLACED:  None.  COMPLICATIONS:  None.  INDICATION:  Dictated in the history and physical.  PROCEDURE:  The patient was taken to the OR and placed in the supine position.  After satisfactory level of general endotracheal anesthesia was obtained, the patient was placed in the dorsal lateral lithotomy position using the Allen stirrups.  The abdomen, perineum, and vagina were prepped out with Betadine.  Bladder was emptied out with catheterization.  Examination revealed a severe cystocele with associated uterine descensus.  A Hulka tenaculum was put in place and secured.  The patient was draped as a sterile field.  Subumbilical incision was made with a knife and carried through the subcutaneous tissue.  Fascia was entered sharply.  Incision was fashioned laterally. Peritoneum was entered sharply.  Open laparoscopic trocar was put in place and secured.  The abdomen was inflated with carbon dioxide. Laparoscope was introduced.  There was no evidence of injury to adjacent organs.  Upper abdomen including liver and tip of the gallbladder were clear.  Appendix  was surgically absent.  Uterus, tubes, and ovaries were unremarkable.  Cul-de-sac clear.  A 5-mm trocar was put in place in the suprapubic area under direct visualization.  Using the EnSeal, we first went to the right side.  The right ovarian vasculature was cauterized and incised.  The mesenteric attachments of the ovary and tube were cauterized and incised up to the round ligament.  The round ligament was then cauterized and incised.  We then went to the left side.  Left tube and ovary elevated.  The left ovarian vessels were cauterized and incised.  The mesenteric attachment of the ovary and tube were cauterized and incised up to the round ligament.  The round ligament was then cauterized and incised.  At this point in time, the abdomen was deflated of carbon dioxide. Laparoscope had been removed.  The patient's legs were repositioned. The Hulka tenaculum was then taken out.  Weighted speculum was placed in vaginal vault.  Cervix was grasped with Christella Hartigan tenaculum.  Cul-de-sac was entered sharply.  Both uterosacral ligaments were clamped, cut, and suture ligated with 0 Vicryl.  The reflection of vaginal mucosa anteriorly was incised.  The paracervical tissue was clamped, cut, and suture ligated with 0 Vicryl.  Vesicouterine space was identified entered sharply.  Retractor was put in place.  Using clamp, cut, and tie technique with suture ligature of 0 Vicryl, the parametrium  serially separated from sides of uterus.  Uterus was then flipped.  Remaining pedicles were clamped and cut.  Uterus, tubes, and ovaries were passed off the operative field and sent to Pathology.  Held pedicles were secured with free ties of 0 Vicryl.  Uterosacral plication stitch of 0 Vicryl was put in place.  We started reapproximating the vaginal mucosa starting posteriorly.  We went up just to the uterosacral ligaments.  At this point in time, we addressed the cystocele.  The vaginal mucosa was instilled with  1% Xylocaine with epinephrine.  We undermined the vaginal mucosa in the midline and incised up to approximately a centimeter below the urethral orifice.  We then dissected the paracervical tissue away from the overlying vaginal mucosa.  We reapproximated that in midline with interrupted sutures of 2-0 Vicryl, thus obliterating the cystocele.  Vaginal ends were trimmed and reapproximated in the midline with interrupted sutures of 2-0 Vicryl. We completed closure of the anterior vaginal cuff  with a 2-0 Vicryl.  Next, we went to the posterior repair.  The incision was made in the skin over the perineal body and it was dissected up to the vaginal opening and excised.  The underlined the vaginal mucosa in the midline, incised it to the midline up to approximately 2 cm from the vaginal cuff.  We dissected the perirectal fascia from the overlying vaginal mucosa.  Next, we identified the right sacrospinous ligament using Capio with a Vicryl suture.  It was passed through the ligament without difficulty and held.  We then using interrupted sutures of 2-0 Vicryl reapproximated the perirectal fascia in the midline thus obliterating the rectocele.  Using the free Mayo needle, the sacrospinous ligament stitch was secured to the vaginal apex on the right side.  We then trimmed the vaginal mucosa.  We then reapproximated in the midline with interrupted sutures of 2-0 Vicryl.  At this point in time, the sacrospinous ligament stitch was tied down with good elevation of the vaginal cuff.  Remaining vaginal mucosa was reapproximated with 2-0 Vicryl.  Peritoneal body was rebuilt with 2-0 Vicryl and skin over the peritoneum, closed with running subcuticular of 2-0 Vicryl.  Cystoscopy was then performed.  The patient had been given indigo carmine.  There was no evidence of injury to the bladder.  Blue-tinged urine were noted to be jetting from both ureteral orifices.  The cystoscope was then removed.  Foley  was placed to straight drain.  The. The abdomen was reinflated with carbon dioxide.  Laparoscope scope was reintroduced.  She did have some bruising at the vaginal cuff and brought under control with the bipolar.  Both sites of ovarian removal were hemostatically intact.  We deflated the abdomen, reinflated, did not see any further bleeding of venous oozing.  We irrigated to remove all irrigation.  At this point in time, the abdomen was deflated with carbon dioxide.  All trocars were removed.  Subumbilical fascia was closed with a figure-of-eight of 0 Vicryl.  Skin was closed with interrupted subcuticular 4-0 Vicryl.  Suprapubic incisions were closed with Dermabond.  The patient was taken out of dorsal position.  Once alert and extubated, transferred to the recovery room in good condition. Sponge, instrument, and needle counts were reported as correct by circulating nurse x2.  Of note, a vaginal pack was put in place.     Juluis Mire, M.D.     JSM/MEDQ  D:  10/13/2011  T:  10/13/2011  Job:  [id number]

## 2011-10-13 NOTE — Anesthesia Postprocedure Evaluation (Signed)
Anesthesia Post Note  Patient: Lisa Patton  Procedure(s) Performed:  LAPAROSCOPIC ASSISTED VAGINAL HYSTERECTOMY; SALPINGO OOPHERECTOMY; ANTERIOR (CYSTOCELE) AND POSTERIOR REPAIR (RECTOCELE) - With Sacrospinal Ligament Suspension; CYSTOSCOPY  Anesthesia type: General  Patient location: PACU  Post pain: Pain level controlled  Post assessment: Post-op Vital signs reviewed  Last Vitals:  Filed Vitals:   10/13/11 1000  BP: 133/44  Pulse: 58  Temp:   Resp: 16    Post vital signs: Reviewed  Level of consciousness: sedated  Complications: No apparent anesthesia complicationsfj

## 2011-10-13 NOTE — Transfer of Care (Signed)
Immediate Anesthesia Transfer of Care Note  Patient: Lisa Patton  Procedure(s) Performed:  LAPAROSCOPIC ASSISTED VAGINAL HYSTERECTOMY; SALPINGO OOPHERECTOMY; ANTERIOR (CYSTOCELE) AND POSTERIOR REPAIR (RECTOCELE) - With Sacrospinal Ligament Suspension; CYSTOSCOPY  Patient Location: PACU  Anesthesia Type: General  Level of Consciousness: awake and sedated  Airway & Oxygen Therapy: Patient Spontanous Breathing and Patient connected to nasal cannula oxygen  Post-op Assessment: Report given to PACU RN and Post -op Vital signs reviewed and stable  Post vital signs: Reviewed and stable  Complications: No apparent anesthesia complications

## 2011-10-13 NOTE — Anesthesia Procedure Notes (Signed)
Procedure Name: Intubation Date/Time: 10/13/2011 7:28 AM Performed by: Lincoln Brigham Pre-anesthesia Checklist: Patient identified, Patient being monitored, Emergency Drugs available, Timeout performed and Suction available Patient Re-evaluated:Patient Re-evaluated prior to inductionOxygen Delivery Method: Circle System Utilized Preoxygenation: Pre-oxygenation with 100% oxygen Intubation Type: IV induction Ventilation: Mask ventilation without difficulty Laryngoscope Size: Miller and 2 Grade View: Grade I Tube type: Oral Tube size: 7.0 mm Number of attempts: 1 Airway Equipment and Method: stylet Placement Confirmation: ETT inserted through vocal cords under direct vision,  breath sounds checked- equal and bilateral and positive ETCO2 Dental Injury: Teeth and Oropharynx as per pre-operative assessment

## 2011-10-14 LAB — CBC
RBC: 4.5 MIL/uL (ref 3.87–5.11)
RDW: 13.6 % (ref 11.5–15.5)
WBC: 12.1 10*3/uL — ABNORMAL HIGH (ref 4.0–10.5)

## 2011-10-14 MED ORDER — HYDROMORPHONE HCL 2 MG PO TABS
2.0000 mg | ORAL_TABLET | ORAL | Status: DC | PRN
  Administered 2011-10-14: 2 mg via ORAL
  Filled 2011-10-14: qty 1

## 2011-10-14 NOTE — Anesthesia Postprocedure Evaluation (Signed)
  Anesthesia Post-op Note  Patient: Lisa Patton  Procedure(s) Performed:  LAPAROSCOPIC ASSISTED VAGINAL HYSTERECTOMY; SALPINGO OOPHERECTOMY; ANTERIOR (CYSTOCELE) AND POSTERIOR REPAIR (RECTOCELE) - With Sacrospinal Ligament Suspension; CYSTOSCOPY  Patient Location: 318  Anesthesia Type: General  Level of Consciousness: awake, alert  and oriented  Airway and Oxygen Therapy: Patient Spontanous Breathing  Post-op Pain: mild  Post-op Assessment: Post-op Vital signs reviewed and Patient's Cardiovascular Status Stable  Post-op Vital Signs: Reviewed and stable  Complications: No apparent anesthesia complications

## 2011-10-14 NOTE — Progress Notes (Signed)
1 Day Post-Op Procedure(s): LAPAROSCOPIC ASSISTED VAGINAL HYSTERECTOMY SALPINGO OOPHERECTOMY ANTERIOR (CYSTOCELE) AND POSTERIOR REPAIR (RECTOCELE) CYSTOSCOPY  Subjective: Patient reports tolerating PO.    Objective: I have reviewed patient's vital signs.  Af vss  General: mild distress  abd soft postive bowel sounds Inc clear,  Vaginal pack removed no active bleeding Hgb 13.6   voide just 100 cc of 350 irrigation  Assessment: s/p Procedure(s): LAPAROSCOPIC ASSISTED VAGINAL HYSTERECTOMY SALPINGO OOPHERECTOMY ANTERIOR (CYSTOCELE) AND POSTERIOR REPAIR (RECTOCELE) CYSTOSCOPY: stable  Plan: Advance to PO medication  LOS: 1 day    Lisa Patton S 10/14/2011, 8:32 AM

## 2011-10-14 NOTE — Addendum Note (Signed)
Addendum  created 10/14/11 1629 by Karleen Dolphin   Modules edited:Notes Section

## 2011-10-14 NOTE — Progress Notes (Signed)
Patient ID: Lisa Patton, female   DOB: [birth year], 55 y.o.   MRN: [id number] S: CO NAUSEA AND VOMITTING O"  AF VSS        ABD SOFT POST FLATUS        VOIDING OK A:  POST OP NAUSES P:  FOLLOW HERE D/C IN AM JM

## 2011-10-15 NOTE — Progress Notes (Signed)
Pt discharged to home with husband.  Condition stable.  Pt to car via wheelchair with C. Clapp, NT.  No equipment for home ordered at discharge.

## 2011-10-15 NOTE — Discharge Summary (Signed)
  Patient name   Rondell Frick DICTATION#  [id number] CSN# [id number]  Juluis Mire, MD 10/15/2011 8:44 AM

## 2011-10-15 NOTE — Discharge Summary (Signed)
[name], Lisa Patton             ACCOUNT NO.:  [id number]  MEDICAL RECORD NO.:  [id number]  LOCATION:  9318                          FACILITY:  [facility]  PHYSICIAN:  Juluis Mire, M.D.   DATE OF BIRTH:  Jun 14, 1956  DATE OF ADMISSION:  10/13/2011 DATE OF DISCHARGE:  10/15/2011                              DISCHARGE SUMMARY   ADMITTING DIAGNOSIS:  Symptomatic pelvic relaxation.  DISCHARGE DIAGNOSIS:  Symptomatic pelvic relaxation.  PROCEDURE:  Laparoscopic-assisted vaginal hysterectomy with bilateral salpingo-oophorectomy.  Anterior and posterior colporrhaphy. Sacrospinous ligament suspension.  Cystoscopy.  For complete history and physical, please see dictated note.  COURSE IN THE HOSPITAL:  The patient underwent above-noted procedure. Postop hemoglobin was excellent.  She was observed during her first postop day due to nausea and vomiting.  The following day, she was afebrile with stable vital signs, and she was tolerating her diet.  She was voiding without difficulty.  Abdomen was soft.  Bowel sounds were active.  She was passing flatus.  All incisions were clear.  She had minimal vaginal bleeding.  In terms of complications, none were encountered during the stay in the hospital.  The patient discharged home in stable condition.  DISPOSITION:  The patient is to avoid heavy lifting, vaginal entrance, or driving a car.  She is to call with signs of infection, nausea, vomiting, active vaginal bleeding, excessive pain.  Also instructed of signs and symptoms of deep venous thrombosis and pulmonary embolus. Discharged home on Dilaudid as needed for pain.  Office will call her Monday and arrange a followup.     Juluis Mire, M.D.     JSM/MEDQ  D:  10/15/2011  T:  10/15/2011  Job:  [id number]

## 2011-10-15 NOTE — Progress Notes (Signed)
2 Days Post-Op Procedure(s): LAPAROSCOPIC ASSISTED VAGINAL HYSTERECTOMY SALPINGO OOPHERECTOMY ANTERIOR (CYSTOCELE) AND POSTERIOR REPAIR (RECTOCELE) CYSTOSCOPY  Subjective: Patient reports tolerating PO.    Objective: I have reviewed patient's vital signs. abd soft post flatus Inc clear Voiding ok Minimal bleedinmg  General: alert  Assessment: s/p Procedure(s): LAPAROSCOPIC ASSISTED VAGINAL HYSTERECTOMY SALPINGO OOPHERECTOMY ANTERIOR (CYSTOCELE) AND POSTERIOR REPAIR (RECTOCELE) CYSTOSCOPY: stable  Plan: Discharge home  LOS: 2 days    Lisa Patton S 10/15/2011, 8:41 AM

## 2011-10-18 ENCOUNTER — Encounter (HOSPITAL_COMMUNITY): Payer: Self-pay | Admitting: Obstetrics and Gynecology

## 2014-07-12 ENCOUNTER — Other Ambulatory Visit: Payer: Self-pay | Admitting: Otolaryngology

## 2014-07-12 DIAGNOSIS — H95199 Other disorders following mastoidectomy, unspecified ear: Secondary | ICD-10-CM

## 2015-11-21 ENCOUNTER — Encounter: Payer: PRIVATE HEALTH INSURANCE | Admitting: Vascular Surgery

## 2015-12-01 ENCOUNTER — Encounter: Payer: Self-pay | Admitting: Vascular Surgery

## 2015-12-10 ENCOUNTER — Encounter: Payer: PRIVATE HEALTH INSURANCE | Admitting: Vascular Surgery

## 2015-12-26 ENCOUNTER — Encounter: Payer: Self-pay | Admitting: Vascular Surgery

## 2015-12-31 ENCOUNTER — Encounter: Payer: Self-pay | Admitting: Vascular Surgery

## 2015-12-31 ENCOUNTER — Ambulatory Visit (INDEPENDENT_AMBULATORY_CARE_PROVIDER_SITE_OTHER): Payer: Commercial Managed Care - PPO | Admitting: Vascular Surgery

## 2015-12-31 VITALS — BP 160/72 | HR 79 | Temp 98.2°F | Ht <= 58 in | Wt 123.1 lb

## 2015-12-31 DIAGNOSIS — I6523 Occlusion and stenosis of bilateral carotid arteries: Secondary | ICD-10-CM | POA: Diagnosis not present

## 2015-12-31 NOTE — Progress Notes (Signed)
[facility]ist [facility]  Patient name: Mayson Gavin MRN: [id number] DOB: 1955/12/28 Sex: female  REASON FOR CONSULT: Ocular TIAs. Referred by Dr. Helene Kelp  HPI: Jaonna Sudar is a 60 y.o. female, who is [place] for carotid evaluation. She is right-handed. On my history, I do not get any history of stroke, TIAs, expressive or receptive aphasia, or amaurosis fugax. As described some dizziness when she's extending her arms above her head.  I have reviewed the records that were sent from Dr. Greggory Keen office. Her renal function is normal. I have also reviewed the note from the ophthalmologist's office. The patient had been seen with some decreased vision. She had been having some episodic tension-type headaches and was felt that she could potentially have ocular migraines versus TIAs.   Past Medical History  Diagnosis Date  . Headache(784.0)   . Heart murmur     pt states had surgery as child  . Hearing loss in left ear     pt has hearing aid in right ear.  . Bronchitis   . Chronic kidney disease     kidney stones x 5 episodes  . Hiatal hernia     no meds - diet controlled  . Kidney stones     No family history on file.  SOCIAL HISTORY: Social History   Social History  . Marital Status: Married    Spouse Name: N/A  . Number of Children: N/A  . Years of Education: N/A   Occupational History  . Not on file.   Social History Main Topics  . Smoking status: Former Smoker -- 1.00 packs/day for 7 years    Types: Cigarettes    Quit date: 09/08/1978  . Smokeless tobacco: Never Used  . Alcohol Use: No  . Drug Use: No  . Sexual Activity: Yes    Birth Control/ Protection: Surgical   Other Topics Concern  . Not on file   Social History Narrative    Allergies  Allergen Reactions  . Codeine Other (See Comments)    Makes pt feel "crazy" and "swimmy-headed"    Current Outpatient Prescriptions  Medication Sig Dispense Refill  . Atorvastatin Calcium (LIPITOR  PO) Take by mouth.     No current facility-administered medications for this visit.    REVIEW OF SYSTEMS:  [X]  denotes positive finding, [ ]  denotes negative finding Cardiac  Comments:  Chest pain or chest pressure:    Shortness of breath upon exertion:    Short of breath when lying flat:    Irregular heart rhythm:        Vascular    Pain in calf, thigh, or hip brought on by ambulation:    Pain in feet at night that wakes you up from your sleep:     Blood clot in your veins:    Leg swelling:         Pulmonary    Oxygen at home:    Productive cough:     Wheezing:         Neurologic    Sudden weakness in arms or legs:     Sudden numbness in arms or legs:     Sudden onset of difficulty speaking or slurred speech:    Temporary loss of vision in one eye:     Problems with dizziness:         Gastrointestinal    Blood in stool:     Vomited blood:         Genitourinary  Burning when urinating:     Blood in urine:        Psychiatric    Major depression:         Hematologic    Bleeding problems:    Problems with blood clotting too easily:        Skin    Rashes or ulcers:        Constitutional    Fever or chills:      PHYSICAL EXAM: Filed Vitals:   12/31/15 1331 12/31/15 1335  BP: 164/66 160/72  Pulse: 79   Temp: 98.2 F (36.8 C)   TempSrc: Oral   Height: 4\' 10"  (1.473 m)   Weight: 123 lb 1.6 oz (55.838 kg)   SpO2: 99%     GENERAL: The patient is a well-nourished female, in no acute distress. The vital signs are documented above. CARDIAC: There is a regular rate and rhythm.  VASCULAR: She has bilateral carotid bruits. She has palpable pedal pulses bilaterally. He has no significant lower extremity swelling. PULMONARY: There is good air exchange bilaterally without wheezing or rales. ABDOMEN: Soft and non-tender with normal pitched bowel sounds.  MUSCULOSKELETAL: There are no major deformities or cyanosis. NEUROLOGIC: No focal weakness or paresthesias  are detected. SKIN: There are no ulcers or rashes noted. PSYCHIATRIC: The patient has a normal affect.  DATA:   CAROTID DUPLEX: I have reviewed the carotid duplex scan that was done at [facility]. There was some mild plaque bilaterally but no significant stenosis was noted. There was a less than 50% stenosis bilaterally.  MEDICAL ISSUES:  MILD BILATERAL CAROTID DISEASE: This patient has only mild plaque bilaterally with no significant stenosis. I do not think that her ocular symptoms can be attributed to her mild carotid disease. I think from a vascular standpoint she is asymptomatic. I have recommended a follow duplex scan in 1 year and I'll see her back at that time. She is to call sooner she has problems. She is on aspirin and is on a statin. With respect to her dizziness, her vertebral arteries are patent and there is no evidence of vertebral basilar insufficiency.  HYPERTENSION: The patient's initial blood pressure today was elevated. We repeated this and this was still elevated. We have encouraged the patient to follow up with their primary care physician for management of their blood pressure.   Deitra Mayo [facility] of [place]: [phone]

## 2015-12-31 NOTE — Addendum Note (Signed)
Addended by: Dorthula Rue L on: 12/31/2015 02:36 PM   Modules accepted: Orders

## 2016-11-23 DIAGNOSIS — Z1231 Encounter for screening mammogram for malignant neoplasm of breast: Secondary | ICD-10-CM | POA: Diagnosis not present

## 2016-11-27 DIAGNOSIS — J189 Pneumonia, unspecified organism: Secondary | ICD-10-CM | POA: Diagnosis not present

## 2016-11-27 DIAGNOSIS — J209 Acute bronchitis, unspecified: Secondary | ICD-10-CM | POA: Diagnosis not present

## 2016-11-28 DIAGNOSIS — R05 Cough: Secondary | ICD-10-CM | POA: Diagnosis not present

## 2016-12-06 DIAGNOSIS — J189 Pneumonia, unspecified organism: Secondary | ICD-10-CM | POA: Diagnosis not present

## 2016-12-09 DIAGNOSIS — N281 Cyst of kidney, acquired: Secondary | ICD-10-CM | POA: Diagnosis not present

## 2016-12-09 DIAGNOSIS — J189 Pneumonia, unspecified organism: Secondary | ICD-10-CM | POA: Diagnosis not present

## 2016-12-29 ENCOUNTER — Encounter: Payer: Self-pay | Admitting: Vascular Surgery

## 2017-01-05 ENCOUNTER — Inpatient Hospital Stay (HOSPITAL_COMMUNITY): Admission: RE | Admit: 2017-01-05 | Payer: Commercial Managed Care - PPO | Source: Ambulatory Visit

## 2017-01-05 ENCOUNTER — Ambulatory Visit: Payer: Commercial Managed Care - PPO

## 2017-04-02 DIAGNOSIS — R079 Chest pain, unspecified: Secondary | ICD-10-CM | POA: Diagnosis not present

## 2017-04-02 DIAGNOSIS — S20212A Contusion of left front wall of thorax, initial encounter: Secondary | ICD-10-CM | POA: Diagnosis not present

## 2017-10-12 DIAGNOSIS — K921 Melena: Secondary | ICD-10-CM | POA: Diagnosis not present

## 2017-10-13 DIAGNOSIS — R195 Other fecal abnormalities: Secondary | ICD-10-CM | POA: Diagnosis not present

## 2017-10-26 DIAGNOSIS — I10 Essential (primary) hypertension: Secondary | ICD-10-CM | POA: Diagnosis not present

## 2017-11-02 DIAGNOSIS — R12 Heartburn: Secondary | ICD-10-CM | POA: Diagnosis not present

## 2017-11-02 DIAGNOSIS — K921 Melena: Secondary | ICD-10-CM | POA: Diagnosis not present

## 2017-11-02 DIAGNOSIS — R1310 Dysphagia, unspecified: Secondary | ICD-10-CM | POA: Diagnosis not present

## 2017-11-07 DIAGNOSIS — K921 Melena: Secondary | ICD-10-CM | POA: Diagnosis not present

## 2017-11-07 DIAGNOSIS — K3189 Other diseases of stomach and duodenum: Secondary | ICD-10-CM | POA: Diagnosis not present

## 2017-11-07 DIAGNOSIS — Z800 Family history of malignant neoplasm of digestive organs: Secondary | ICD-10-CM | POA: Diagnosis not present

## 2017-11-07 DIAGNOSIS — R1310 Dysphagia, unspecified: Secondary | ICD-10-CM | POA: Diagnosis not present

## 2017-11-07 DIAGNOSIS — K219 Gastro-esophageal reflux disease without esophagitis: Secondary | ICD-10-CM | POA: Diagnosis not present

## 2017-11-07 DIAGNOSIS — K625 Hemorrhage of anus and rectum: Secondary | ICD-10-CM | POA: Diagnosis not present

## 2017-11-07 DIAGNOSIS — K228 Other specified diseases of esophagus: Secondary | ICD-10-CM | POA: Diagnosis not present

## 2017-11-07 DIAGNOSIS — K2900 Acute gastritis without bleeding: Secondary | ICD-10-CM | POA: Diagnosis not present

## 2017-11-24 DIAGNOSIS — Z1231 Encounter for screening mammogram for malignant neoplasm of breast: Secondary | ICD-10-CM | POA: Diagnosis not present

## 2017-12-26 DIAGNOSIS — J209 Acute bronchitis, unspecified: Secondary | ICD-10-CM | POA: Diagnosis not present

## 2017-12-26 DIAGNOSIS — K047 Periapical abscess without sinus: Secondary | ICD-10-CM | POA: Diagnosis not present

## 2018-02-16 DIAGNOSIS — K5900 Constipation, unspecified: Secondary | ICD-10-CM | POA: Diagnosis not present

## 2018-02-16 DIAGNOSIS — R1030 Lower abdominal pain, unspecified: Secondary | ICD-10-CM | POA: Diagnosis not present

## 2018-02-28 DIAGNOSIS — H9201 Otalgia, right ear: Secondary | ICD-10-CM | POA: Diagnosis not present

## 2018-11-28 DIAGNOSIS — R509 Fever, unspecified: Secondary | ICD-10-CM | POA: Diagnosis not present

## 2018-11-28 DIAGNOSIS — R070 Pain in throat: Secondary | ICD-10-CM | POA: Diagnosis not present

## 2018-11-28 DIAGNOSIS — J020 Streptococcal pharyngitis: Secondary | ICD-10-CM | POA: Diagnosis not present

## 2019-01-29 DIAGNOSIS — N644 Mastodynia: Secondary | ICD-10-CM | POA: Diagnosis not present

## 2019-01-29 DIAGNOSIS — Z6828 Body mass index (BMI) 28.0-28.9, adult: Secondary | ICD-10-CM | POA: Diagnosis not present

## 2019-01-29 DIAGNOSIS — M25512 Pain in left shoulder: Secondary | ICD-10-CM | POA: Diagnosis not present

## 2019-02-12 DIAGNOSIS — N644 Mastodynia: Secondary | ICD-10-CM | POA: Diagnosis not present

## 2019-03-01 DIAGNOSIS — M25512 Pain in left shoulder: Secondary | ICD-10-CM | POA: Diagnosis not present

## 2022-02-03 ENCOUNTER — Ambulatory Visit
Admission: RE | Admit: 2022-02-03 | Discharge: 2022-02-03 | Disposition: A | Discharge status: Home / Self Care | Payer: Managed Care, Other (non HMO) | Source: Ambulatory Visit | Attending: Internal Medicine | Admitting: Internal Medicine

## 2022-02-03 ENCOUNTER — Other Ambulatory Visit: Payer: Self-pay | Admitting: Internal Medicine

## 2022-02-03 DIAGNOSIS — R1011 Right upper quadrant pain: Secondary | ICD-10-CM

## 2022-02-03 DIAGNOSIS — M549 Dorsalgia, unspecified: Secondary | ICD-10-CM

## 2022-02-11 ENCOUNTER — Ambulatory Visit
Admission: RE | Admit: 2022-02-11 | Discharge: 2022-02-11 | Disposition: A | Discharge status: Home / Self Care | Payer: Managed Care, Other (non HMO) | Source: Ambulatory Visit | Attending: Internal Medicine | Admitting: Internal Medicine

## 2022-02-11 ENCOUNTER — Other Ambulatory Visit: Payer: Self-pay | Admitting: Internal Medicine

## 2022-02-11 DIAGNOSIS — S32020A Wedge compression fracture of second lumbar vertebra, initial encounter for closed fracture: Secondary | ICD-10-CM

## 2022-02-11 DIAGNOSIS — R1011 Right upper quadrant pain: Secondary | ICD-10-CM

## 2022-03-03 ENCOUNTER — Other Ambulatory Visit: Payer: Managed Care, Other (non HMO)

## 2022-03-24 ENCOUNTER — Ambulatory Visit
Admission: RE | Admit: 2022-03-24 | Discharge: 2022-03-24 | Disposition: A | Discharge status: Home / Self Care | Payer: Commercial Managed Care - HMO | Source: Ambulatory Visit | Attending: Internal Medicine | Admitting: Internal Medicine

## 2022-03-24 DIAGNOSIS — S32020A Wedge compression fracture of second lumbar vertebra, initial encounter for closed fracture: Secondary | ICD-10-CM

## 2022-03-24 MED ORDER — GADOBENATE DIMEGLUMINE 529 MG/ML IV SOLN
14.0000 mL | Freq: Once | INTRAVENOUS | Status: AC | PRN
  Administered 2022-03-24: 14 mL via INTRAVENOUS

## 2022-04-14 ENCOUNTER — Other Ambulatory Visit: Payer: Self-pay | Admitting: Gastroenterology

## 2022-04-14 DIAGNOSIS — R1011 Right upper quadrant pain: Secondary | ICD-10-CM

## 2022-04-20 ENCOUNTER — Encounter (HOSPITAL_COMMUNITY): Payer: Self-pay

## 2022-04-20 ENCOUNTER — Other Ambulatory Visit: Payer: Self-pay

## 2022-04-20 ENCOUNTER — Emergency Department (HOSPITAL_COMMUNITY): Payer: Commercial Managed Care - HMO

## 2022-04-20 ENCOUNTER — Emergency Department (HOSPITAL_COMMUNITY)
Admission: EM | Admit: 2022-04-20 | Discharge: 2022-04-20 | Disposition: A | Discharge status: Home / Self Care | Payer: Commercial Managed Care - HMO | Attending: Emergency Medicine | Admitting: Emergency Medicine

## 2022-04-20 DIAGNOSIS — M545 Low back pain, unspecified: Secondary | ICD-10-CM | POA: Insufficient documentation

## 2022-04-20 DIAGNOSIS — D72829 Elevated white blood cell count, unspecified: Secondary | ICD-10-CM | POA: Insufficient documentation

## 2022-04-20 DIAGNOSIS — R109 Unspecified abdominal pain: Secondary | ICD-10-CM

## 2022-04-20 DIAGNOSIS — R1110 Vomiting, unspecified: Secondary | ICD-10-CM | POA: Diagnosis not present

## 2022-04-20 DIAGNOSIS — Z87442 Personal history of urinary calculi: Secondary | ICD-10-CM | POA: Diagnosis not present

## 2022-04-20 DIAGNOSIS — R1013 Epigastric pain: Secondary | ICD-10-CM | POA: Diagnosis not present

## 2022-04-20 DIAGNOSIS — R1031 Right lower quadrant pain: Secondary | ICD-10-CM | POA: Diagnosis present

## 2022-04-20 DIAGNOSIS — R197 Diarrhea, unspecified: Secondary | ICD-10-CM | POA: Insufficient documentation

## 2022-04-20 DIAGNOSIS — R1032 Left lower quadrant pain: Secondary | ICD-10-CM | POA: Diagnosis not present

## 2022-04-20 LAB — COMPREHENSIVE METABOLIC PANEL
ALT: 14 U/L (ref 0–44)
AST: 21 U/L (ref 15–41)
Albumin: 4.3 g/dL (ref 3.5–5.0)
Alkaline Phosphatase: 65 U/L (ref 38–126)
Anion gap: 10 (ref 5–15)
BUN: 21 mg/dL (ref 8–23)
CO2: 22 mmol/L (ref 22–32)
Calcium: 9.4 mg/dL (ref 8.9–10.3)
Chloride: 106 mmol/L (ref 98–111)
Creatinine, Ser: 0.71 mg/dL (ref 0.44–1.00)
GFR, Estimated: >60 mL/min (ref >60)
Glucose, Bld: 136 mg/dL — ABNORMAL HIGH (ref 70–99)
Potassium: 4.4 mmol/L (ref 3.5–5.1)
Sodium: 138 mmol/L (ref 135–145)
Total Bilirubin: 0.7 mg/dL (ref 0.3–1.2)
Total Protein: 8.2 g/dL — ABNORMAL HIGH (ref 6.5–8.1)

## 2022-04-20 LAB — CBC
HCT: 48.4 % — ABNORMAL HIGH (ref 36.0–46.0)
Hemoglobin: 16.1 g/dL — ABNORMAL HIGH (ref 12.0–15.0)
MCH: 29.8 pg (ref 26.0–34.0)
MCHC: 33.3 g/dL (ref 30.0–36.0)
MCV: 89.5 fL (ref 80.0–100.0)
Platelets: 237 10*3/uL (ref 150–400)
RBC: 5.41 MIL/uL — ABNORMAL HIGH (ref 3.87–5.11)
RDW: 13.2 % (ref 11.5–15.5)
WBC: 11 10*3/uL — ABNORMAL HIGH (ref 4.0–10.5)
nRBC: 0 % (ref 0.0–0.2)

## 2022-04-20 LAB — URINALYSIS, ROUTINE W REFLEX MICROSCOPIC
Bilirubin Urine: NEGATIVE
Glucose, UA: NEGATIVE mg/dL
Ketones, ur: NEGATIVE mg/dL
Nitrite: NEGATIVE
Protein, ur: NEGATIVE mg/dL
Specific Gravity, Urine: 1.008 (ref 1.005–1.030)
pH: 6 (ref 5.0–8.0)

## 2022-04-20 LAB — LIPASE, BLOOD: Lipase: 32 U/L (ref 11–51)

## 2022-04-20 MED ORDER — IOHEXOL 300 MG/ML  SOLN
80.0000 mL | Freq: Once | INTRAMUSCULAR | Status: AC | PRN
  Administered 2022-04-20: 80 mL via INTRAVENOUS

## 2022-04-20 MED ORDER — SODIUM CHLORIDE 0.9 % IV BOLUS
1000.0000 mL | Freq: Once | INTRAVENOUS | Status: AC
  Administered 2022-04-20: 1000 mL via INTRAVENOUS

## 2022-04-20 MED ORDER — FENTANYL CITRATE PF 50 MCG/ML IJ SOSY
50.0000 ug | PREFILLED_SYRINGE | Freq: Once | INTRAMUSCULAR | Status: AC
  Administered 2022-04-20: 50 ug via INTRAVENOUS
  Filled 2022-04-20: qty 1

## 2022-04-20 MED ORDER — OXYCODONE HCL 5 MG PO TABS: 5.0000 mg | ORAL_TABLET | ORAL | 0 refills | Status: AC | PRN

## 2022-04-20 MED ORDER — SUCRALFATE 1 GM/10ML PO SUSP: 1.0000 g | Freq: Three times a day (TID) | ORAL | 0 refills | Status: AC

## 2022-04-20 MED ORDER — PANTOPRAZOLE SODIUM 40 MG PO TBEC: 40.0000 mg | DELAYED_RELEASE_TABLET | Freq: Every day | ORAL | 0 refills | Status: AC

## 2022-04-20 NOTE — ED Provider Notes (Signed)
[facility] DEPT Provider Note   CSN: [id number] Arrival date & time: 04/20/22  0949     History  Chief Complaint  Patient presents with   Abdominal Pain   Emesis    Lisa Patton is a 66 y.o. female.   Abdominal Pain Associated symptoms: vomiting   Associated symptoms: no fever   Emesis Associated symptoms: abdominal pain   Associated symptoms: no fever     Patient presents with complaints of abdominal pain that started over the weekend.  Patient states she has been having pain in her upper abdomen radiating to her back.  She did have an episode of vomiting.  She also had an episode of loose stool.  Pain is severe and affecting her sleep.  Patient however also has back discomfort that is increased with movement and palpation.  She has had a tubal ligation and appendectomy and hysterectomy.  She does have history of hiatal hernia, kidney stones, hearing loss spinal stenosis, lumbar facet arthropathy and foraminal stenosis  Home Medications Prior to Admission medications   Medication Sig Start Date End Date Taking? Authorizing Provider  Atorvastatin Calcium (LIPITOR PO) Take by mouth.    [provider]      Allergies    Codeine    Review of Systems   Review of Systems  Constitutional:  Negative for fever.  Gastrointestinal:  Positive for abdominal pain and vomiting.    Physical Exam Updated Vital Signs BP (!) 158/94   Pulse 96   Temp 98 F (36.7 C) (Oral)   Resp 18   Ht 1.448 m ('4\' 9"'$ )   Wt 64 kg   LMP 09/30/2011   SpO2 98%   BMI 30.51 kg/m  Physical Exam Vitals and nursing note reviewed.  Constitutional:      General: She is not in acute distress.    Appearance: She is well-developed.  HENT:     Head: Normocephalic and atraumatic.     Right Ear: External ear normal.     Left Ear: External ear normal.  Eyes:     General: No scleral icterus.       Right eye: No discharge.        Left eye: No discharge.      Conjunctiva/sclera: Conjunctivae normal.  Neck:     Trachea: No tracheal deviation.  Cardiovascular:     Rate and Rhythm: Normal rate and regular rhythm.  Pulmonary:     Effort: Pulmonary effort is normal. No respiratory distress.     Breath sounds: Normal breath sounds. No stridor. No wheezing or rales.  Abdominal:     General: Bowel sounds are normal. There is no distension.     Palpations: Abdomen is soft.     Tenderness: There is abdominal tenderness in the right lower quadrant, epigastric area and left lower quadrant. There is no guarding or rebound.  Musculoskeletal:        General: No deformity.     Cervical back: Neck supple.     Lumbar back: Tenderness present. No edema, signs of trauma or spasms.  Skin:    General: Skin is warm and dry.     Findings: No rash.  Neurological:     General: No focal deficit present.     Mental Status: She is alert.     Cranial Nerves: No cranial nerve deficit (no facial droop, extraocular movements intact, no slurred speech).     Sensory: No sensory deficit.     Motor: No abnormal  muscle tone or seizure activity.     Coordination: Coordination normal.  Psychiatric:        Mood and Affect: Mood normal.     ED Results / Procedures / Treatments   Labs (all labs ordered are listed, but only abnormal results are displayed) Labs Reviewed  COMPREHENSIVE METABOLIC PANEL - Abnormal; Notable for the following components:      Result Value   Glucose, Bld 136 (*)    Total Protein 8.2 (*)    All other components within normal limits  CBC - Abnormal; Notable for the following components:   WBC 11.0 (*)    RBC 5.41 (*)    Hemoglobin 16.1 (*)    HCT 48.4 (*)    All other components within normal limits  URINALYSIS, ROUTINE W REFLEX MICROSCOPIC - Abnormal; Notable for the following components:   Hgb urine dipstick SMALL (*)    Leukocytes,Ua SMALL (*)    Bacteria, UA RARE (*)    All other components within normal limits  LIPASE, BLOOD     EKG None  Radiology No results found.  Procedures Procedures    Medications Ordered in ED Medications  iohexol (OMNIPAQUE) 300 MG/ML solution 80 mL (80 mLs Intravenous Contrast Given 04/20/22 1609)    ED Course/ Medical Decision Making/ A&P Clinical Course as of 04/20/22 1643  Tue Apr 20, 2022  1459 Comprehensive metabolic panel(!) Normal [JK]  1459 Urinalysis, Routine w reflex microscopic Urine, Clean Catch(!) Does not suggest infection or kidney stone  [JK]  1500 CBC(!) Leukocytosis. [JK]    Clinical Course User Index [JK] Dorie Rank, MD                           Medical Decision Making Differential diagnosis includes but not limited to pancreatitis, cholecystitis, diverticulitis, UTI, ureterolithiasis  Amount and/or Complexity of Data Reviewed Labs: ordered. Decision-making details documented in ED Course. Radiology: ordered.  Risk Prescription drug management.   Pt presents with acute abd pain back pain.  Labs do not show hepatitis, pancreatitis, uti.  Pt has some ttp in back.  Previous imaging test show DDD, facet arthropathy.  Pt given IV pain meds, fluids.  CT scan ordered.  Care turned over to Dr Regenia Skeeter.        Final Clinical Impression(s) / ED Diagnoses Final diagnoses:  Abdominal pain, unspecified abdominal location  Acute midline low back pain without sciatica    Rx / DC Orders ED Discharge Orders     None         Dorie Rank, MD 04/20/22 1643

## 2022-04-20 NOTE — ED Triage Notes (Addendum)
Patient c/o LUQ abdominal pain x 2 days and vomiting since last night. Patient states the pain radiates into the mid back. Patient states she has not had a normal BM in a week. Patient states that she had a small BM yesterday and a small amount of diarrhea 2 days before that.

## 2022-04-20 NOTE — ED Provider Notes (Signed)
5:13 PM Patient's CT viewed/interpreted.  There is some cholelithiasis but no obvious cholecystitis.  She is diffusely tender though worse in the upper abdomen for me and especially in the right upper quadrant.  I think is reasonable to get a right upper quadrant ultrasound.  She is also tachycardic to around 110 and we will give some fluids and pain control.  Right upper quadrant ultrasound is unremarkable.  At this point, no clear indication of why she is hurting.  Could be stomach pathology so we will give PPI and Carafate.  Seems stable for discharge home.  Follow-up with PCP.  Given return precautions   Sherwood Gambler, MD 04/20/22 [phone]

## 2022-04-20 NOTE — Discharge Instructions (Addendum)
If you develop worsening, continued, or recurrent abdominal pain, uncontrolled vomiting, fever, chest or back pain, or any other new/concerning symptoms then return to the ER for evaluation.  

## 2022-04-20 NOTE — ED Provider Triage Note (Signed)
Emergency Medicine Provider Triage Evaluation Note  Lisa Patton , a 66 y.o. female  was evaluated in triage.  Pt complains of abdominal pain.  Patient states she started having symptoms over the weekend.  She has not been able to sleep much because of the pain and discomfort in her abdomen that goes into her back.  She has had an episode of vomiting as well as loose stools.  She denies any dysuria the pain is severe.  Review of Systems  Positive: Abdominal pain Negative: Chest pain  Physical Exam  BP 121/67 (BP Location: Right Arm)   Pulse (!) 109   Temp 98 F (36.7 C) (Oral)   Resp 18   Ht 1.448 m ('4\' 9"'$ )   Wt 64 kg   LMP 09/30/2011   SpO2 100%   BMI 30.51 kg/m  Gen:   Awake, no distress   Resp:  Normal effort  MSK:   Moves extremities without difficulty  Other:  Tenderness to palpation in the abdomen diffusely  Medical Decision Making  Medically screening exam initiated at 10:25 AM.  Appropriate orders placed.  [name] was informed that the remainder of the evaluation will be completed by another provider, this initial triage assessment does not replace that evaluation, and the importance of remaining in the ED until their evaluation is complete.  We will proceed with abdominal pain evaluation.   Dorie Rank, MD 04/20/22 1026

## 2022-05-12 ENCOUNTER — Other Ambulatory Visit: Payer: Commercial Managed Care - HMO

## 2022-09-15 ENCOUNTER — Ambulatory Visit: Payer: Commercial Managed Care - HMO | Admitting: Obstetrics and Gynecology

## 2022-09-15 NOTE — Progress Notes (Deleted)
[facility] New Patient Evaluation and Consultation  Referring Provider: Christophe Louis, MD PCP: Ronita Hipps, MD Date of Service: 09/15/2022  SUBJECTIVE Chief Complaint: No chief complaint on file.  History of Present Illness: Lisa Patton is a 66 y.o. White or Caucasian female seen in consultation at the request of Dr. Landry Mellow for evaluation of prolapse and incontinence.    Review of records from Dr Landry Mellow significant for: Has mixed incontinence. Tried oxybutynin but had side effects and myrbetriq was too expensive. Also noted to have cystocele.  Urinary Symptoms: {urine leakage?:24754} Leaks *** time(s) per {days/wks/mos/yrs:310907}.  Pad use: {NUMBERS 1-10:18281} {pad option:24752} per day.   She {ACTION; IS/IS KGU:54270623} bothered by her UI symptoms.  Day time voids ***.  Nocturia: *** times per night to void. Voiding dysfunction: she {empties:24755} her bladder well.  {DOES NOT does:27190::"does not"} use a catheter to empty bladder.  When urinating, she feels {urine symptoms:24756} Drinks: *** per day  UTIs: {NUMBERS 1-10:18281} UTI's in the last year.   {ACTIONS;DENIES/REPORTS:21021675::"Denies"} history of {urologic concerns:24757}  Pelvic Organ Prolapse Symptoms:                  She {denies/ admits to:24761} a feeling of a bulge the vaginal area. It has been present for {NUMBER 1-10:22536} {days/wks/mos/yrs:310907}.  She {denies/ admits to:24761} seeing a bulge.  This bulge {ACTION; IS/IS JSE:83151761} bothersome.  Bowel Symptom: Bowel movements: *** time(s) per {Time; day/week/month:13537} Stool consistency: {stool consistency:24758} Straining: {yes/no:19897}.  Splinting: {yes/no:19897}.  Incomplete evacuation: {yes/no:19897}.  She {denies/ admits to:24761} accidental bowel leakage / fecal incontinence  Occurs: *** time(s) per {Time; day/week/month:13537}  Consistency with leakage: {stool consistency:24758} Bowel regimen: {bowel regimen:24759} Last  colonoscopy: Date ***, Results ***  Sexual Function Sexually active: {yes/no:19897}.  Sexual orientation: {Sexual Orientation:3104084146} Pain with sex: {pain with sex:24762}  Pelvic Pain {denies/ admits to:24761} pelvic pain Location: *** Pain occurs: *** Prior pain treatment: *** Improved by: *** Worsened by: ***   Past Medical History:  Past Medical History:  Diagnosis Date   Bronchitis    Chronic kidney disease    kidney stones x 5 episodes   Headache(784.0)    Hearing loss in left ear    pt has hearing aid in right ear.   Heart murmur    pt states had surgery as child   Hiatal hernia    no meds - diet controlled   Kidney stones      Past Surgical History:   Past Surgical History:  Procedure Laterality Date   ANTERIOR AND POSTERIOR REPAIR  10/13/2011   Procedure: ANTERIOR (CYSTOCELE) AND POSTERIOR REPAIR (RECTOCELE);  Surgeon: Darlyn Chamber;  Location: [facility];  Service: Gynecology;  Laterality: N/A;  With Sacrospinal Ligament Suspension   APPENDECTOMY     CARDIAC VALVE SURGERY     age at 66 yrs old   COLONOSCOPY     CYSTOSCOPY  10/13/2011   Procedure: CYSTOSCOPY;  Surgeon: Darlyn Chamber;  Location: [facility];  Service: Gynecology;  Laterality: N/A;   DIAGNOSTIC LAPAROSCOPY     for endometriosis   DILATION AND CURETTAGE OF UTERUS  07/2011   resection of polyp   EXTRACORPOREAL SHOCK WAVE LITHOTRIPSY     x 3 surgeries   LAPAROSCOPIC ASSISTED VAGINAL HYSTERECTOMY  10/13/2011   Procedure: LAPAROSCOPIC ASSISTED VAGINAL HYSTERECTOMY;  Surgeon: Darlyn Chamber;  Location: [facility];  Service: Gynecology;  Laterality: N/A;   SALPINGOOPHORECTOMY  10/13/2011   Procedure: SALPINGO OOPHERECTOMY;  Surgeon: Darlyn Chamber;  Location:  [facility];  Service: Gynecology;  Laterality: Bilateral;   SVD     x 2   TUBAL LIGATION     UPPER GASTROINTESTINAL ENDOSCOPY       Past OB/GYN History: G{NUMBERS 1-10:18281} P{NUMBERS 1-10:18281} Vaginal deliveries: ***,  Forceps/ Vacuum deliveries:  ***, Cesarean section: *** Menopausal: {menopausal:24763} Contraception: ***. Last pap smear was ***.  Any history of abnormal pap smears: {yes/no:19897}.   Medications: She has a current medication list which includes the following prescription(s): atorvastatin calcium, oxycodone, pantoprazole, and sucralfate.   Allergies: Patient is allergic to codeine.   Social History:  Social History   Tobacco Use   Smoking status: Former    Packs/day: 1.00    Years: 7.00    Total pack years: 7.00    Types: Cigarettes    Quit date: 09/08/1978    Years since quitting: 44.0   Smokeless tobacco: Never  Vaping Use   Vaping Use: Never used  Substance Use Topics   Alcohol use: No    Alcohol/week: 0.0 standard drinks of alcohol   Drug use: No    Relationship status: {relationship status:24764} She lives with ***.   She {ACTION; IS/IS IPJ:82505397} employed ***. Regular exercise: {Yes/No:304960894} History of abuse: {Yes/No:304960894}  Family History:  No family history on file.   Review of Systems: ROS   OBJECTIVE Physical Exam: There were no vitals filed for this visit.  Physical Exam   GU / Detailed Urogynecologic Evaluation:  Pelvic Exam: Normal external female genitalia; Bartholin's and Skene's glands normal in appearance; urethral meatus normal in appearance, no urethral masses or discharge.   CST: {gen negative/positive:315881}  Reflexes: bulbocavernosis {DESC; PRESENT/NOT PRESENT:21021351}, anocutaneous {DESC; PRESENT/NOT PRESENT:21021351} ***bilaterally.  Speculum exam reveals normal vaginal mucosa {With/Without:20273} atrophy. Cervix {exam; gyn cervix:30847}. Uterus {exam; pelvic uterus:30849}. Adnexa {exam; adnexa:12223}.    s/p hysterectomy: Speculum exam reveals normal vaginal mucosa {With/Without:20273}  atrophy and normal vaginal cuff.  Adnexa {exam; adnexa:12223}.    With apex supported, anterior compartment defect was {reduced:24765}  Pelvic floor strength  {Roman # I-V:19040}/V, puborectalis {Roman # I-V:19040}/V external anal sphincter {Roman # I-V:19040}/V  Pelvic floor musculature: Right levator {Tender/Non-tender:20250}, Right obturator {Tender/Non-tender:20250}, Left levator {Tender/Non-tender:20250}, Left obturator {Tender/Non-tender:20250}  POP-Q:   POP-Q                                               Aa                                               Ba                                                 C                                                Gh  Pb                                               tvl                                                Ap                                               Bp                                                 D      Rectal Exam:  Normal sphincter tone, {rectocele:24766} distal rectocele, enterocoele {DESC; PRESENT/NOT PRESENT:21021351}, no rectal masses, {sign of:24767} dyssynergia when asking the patient to bear down.  Post-Void Residual (PVR) by Bladder Scan: In order to evaluate bladder emptying, we discussed obtaining a postvoid residual and she agreed to this procedure.  Procedure: The ultrasound unit was placed on the patient's abdomen in the suprapubic region after the patient had voided. A PVR of *** ml was obtained by bladder scan.  Laboratory Results: '@ENCLABS'$ @   ***I visualized the urine specimen, noting the specimen to be {urine color:24768}  ASSESSMENT AND PLAN Ms. Tome is a 66 y.o. with: No diagnosis found.    Jaquita Folds, MD   Medical Decision Making:  - Reviewed/ ordered a clinical laboratory test - Reviewed/ ordered a radiologic study - Reviewed/ ordered medicine test - Decision to obtain old records - Discussion of management of or test interpretation with an external physician / other healthcare professional  - Assessment requiring independent historian - Review and summation of prior  records - Independent review of image, tracing or specimen

## 2022-12-09 ENCOUNTER — Emergency Department (HOSPITAL_COMMUNITY)
Admission: EM | Admit: 2022-12-09 | Discharge: 2022-12-10 | Disposition: A | Discharge status: Home / Self Care | Payer: Medicare (Managed Care) | Attending: Emergency Medicine | Admitting: Emergency Medicine

## 2022-12-09 DIAGNOSIS — R1030 Lower abdominal pain, unspecified: Secondary | ICD-10-CM | POA: Insufficient documentation

## 2022-12-09 DIAGNOSIS — D72829 Elevated white blood cell count, unspecified: Secondary | ICD-10-CM | POA: Diagnosis not present

## 2022-12-09 LAB — COMPREHENSIVE METABOLIC PANEL
ALT: 18 U/L (ref 0–44)
AST: 25 U/L (ref 15–41)
Albumin: 4.1 g/dL (ref 3.5–5.0)
Alkaline Phosphatase: 68 U/L (ref 38–126)
Anion gap: 11 (ref 5–15)
BUN: 25 mg/dL — ABNORMAL HIGH (ref 8–23)
CO2: 24 mmol/L (ref 22–32)
Calcium: 9.6 mg/dL (ref 8.9–10.3)
Chloride: 100 mmol/L (ref 98–111)
Creatinine, Ser: 1.05 mg/dL — ABNORMAL HIGH (ref 0.44–1.00)
GFR, Estimated: 59 mL/min — ABNORMAL LOW (ref >60)
Glucose, Bld: 158 mg/dL — ABNORMAL HIGH (ref 70–99)
Potassium: 4.1 mmol/L (ref 3.5–5.1)
Sodium: 135 mmol/L (ref 135–145)
Total Bilirubin: 1.1 mg/dL (ref 0.3–1.2)
Total Protein: 8.2 g/dL — ABNORMAL HIGH (ref 6.5–8.1)

## 2022-12-09 LAB — URINALYSIS, ROUTINE W REFLEX MICROSCOPIC
Bilirubin Urine: NEGATIVE
Glucose, UA: NEGATIVE mg/dL
Hgb urine dipstick: NEGATIVE
Ketones, ur: 20 mg/dL — AB
Nitrite: NEGATIVE
Protein, ur: NEGATIVE mg/dL
Specific Gravity, Urine: 1.015 (ref 1.005–1.030)
pH: 5 (ref 5.0–8.0)

## 2022-12-09 LAB — LIPASE, BLOOD: Lipase: 38 U/L (ref 11–51)

## 2022-12-09 LAB — CBC
HCT: 45.8 % (ref 36.0–46.0)
Hemoglobin: 15.2 g/dL — ABNORMAL HIGH (ref 12.0–15.0)
MCH: 29.6 pg (ref 26.0–34.0)
MCHC: 33.2 g/dL (ref 30.0–36.0)
MCV: 89.1 fL (ref 80.0–100.0)
Platelets: 244 10*3/uL (ref 150–400)
RBC: 5.14 MIL/uL — ABNORMAL HIGH (ref 3.87–5.11)
RDW: 13.1 % (ref 11.5–15.5)
WBC: 18.4 10*3/uL — ABNORMAL HIGH (ref 4.0–10.5)
nRBC: 0 % (ref 0.0–0.2)

## 2022-12-09 MED ORDER — OXYCODONE-ACETAMINOPHEN 5-325 MG PO TABS: 1.0000 | ORAL_TABLET | Freq: Four times a day (QID) | ORAL | 0 refills | Status: DC | PRN

## 2022-12-09 MED ORDER — HYDROMORPHONE HCL 1 MG/ML IJ SOLN
0.5000 mg | Freq: Once | INTRAMUSCULAR | Status: AC
  Administered 2022-12-09: 0.5 mg via INTRAVENOUS
  Filled 2022-12-09: qty 1

## 2022-12-09 MED ORDER — ONDANSETRON 4 MG PO TBDP: ORAL_TABLET | ORAL | 0 refills | Status: DC

## 2022-12-09 MED ORDER — ONDANSETRON HCL 4 MG/2ML IJ SOLN
4.0000 mg | Freq: Once | INTRAMUSCULAR | Status: AC
  Administered 2022-12-09: 4 mg via INTRAVENOUS
  Filled 2022-12-09: qty 2

## 2022-12-09 MED ORDER — ONDANSETRON 4 MG PO TBDP: ORAL_TABLET | ORAL | 0 refills | Status: AC

## 2022-12-09 MED ORDER — SODIUM CHLORIDE 0.9 % IV BOLUS
500.0000 mL | Freq: Once | INTRAVENOUS | Status: AC
  Administered 2022-12-09: 500 mL via INTRAVENOUS

## 2022-12-09 MED ORDER — HYDROCODONE-ACETAMINOPHEN 5-325 MG PO TABS: 1.0000 | ORAL_TABLET | Freq: Four times a day (QID) | ORAL | 0 refills | Status: AC | PRN

## 2022-12-09 NOTE — ED Notes (Signed)
Ambulatory to restroom

## 2022-12-09 NOTE — ED Provider Notes (Signed)
[place] EMERGENCY DEPARTMENT AT [facility] Provider Note   CSN: [id number] Arrival date & time: 12/09/22  2033     History  Chief Complaint  Patient presents with   Abdominal Pain    Lisa Patton is a 67 y.o. female.  Patient has a history of GERD and a prolapsed bladder.  She states that she started having lower abdominal discomfort.  She was doing some physical activity that seem to bother her belly.  Patient was seen yesterday in the emergency department had a CT scan done.  The history is provided by the patient and medical records. No language interpreter was used.  Abdominal Pain Pain location:  Suprapubic Pain quality: aching   Pain radiates to:  Does not radiate Pain severity:  Moderate Onset quality:  Sudden Progression:  Waxing and waning Chronicity:  New Context: not alcohol use   Relieved by:  Nothing Worsened by:  Nothing Associated symptoms: no chest pain, no cough, no diarrhea, no fatigue and no hematuria        Home Medications Prior to Admission medications   Medication Sig Start Date End Date Taking? Authorizing Provider  ondansetron (ZOFRAN-ODT) 4 MG disintegrating tablet '4mg'$  ODT q4 hours prn nausea/vomit 12/09/22  Yes Milton Ferguson, MD  oxyCODONE-acetaminophen (PERCOCET) 5-325 MG tablet Take 1 tablet by mouth every 6 (six) hours as needed. 12/09/22  Yes Milton Ferguson, MD  Atorvastatin Calcium (LIPITOR PO) Take by mouth.    [provider]  oxyCODONE (ROXICODONE) 5 MG immediate release tablet Take 1 tablet (5 mg total) by mouth every 4 (four) hours as needed for severe pain. 04/20/22   Sherwood Gambler, MD  pantoprazole (PROTONIX) 40 MG tablet Take 1 tablet (40 mg total) by mouth daily. 04/20/22   Sherwood Gambler, MD  sucralfate (CARAFATE) 1 GM/10ML suspension Take 10 mLs (1 g total) by mouth 4 (four) times daily -  with meals and at bedtime. 04/20/22   Sherwood Gambler, MD      Allergies    Codeine    Review of Systems   Review  of Systems  Constitutional:  Negative for appetite change and fatigue.  HENT:  Negative for congestion, ear discharge and sinus pressure.   Eyes:  Negative for discharge.  Respiratory:  Negative for cough.   Cardiovascular:  Negative for chest pain.  Gastrointestinal:  Positive for abdominal pain. Negative for diarrhea.  Genitourinary:  Negative for frequency and hematuria.  Musculoskeletal:  Negative for back pain.  Skin:  Negative for rash.  Neurological:  Negative for seizures and headaches.  Psychiatric/Behavioral:  Negative for hallucinations.     Physical Exam Updated Vital Signs BP (!) 115/33   Pulse 98   Temp 97.8 F (36.6 C) (Oral)   Resp 18   LMP 09/30/2011   SpO2 96%  Physical Exam Vitals and nursing note reviewed.  Constitutional:      Appearance: She is well-developed.  HENT:     Head: Normocephalic.     Nose: Nose normal.  Eyes:     General: No scleral icterus.    Conjunctiva/sclera: Conjunctivae normal.  Neck:     Thyroid: No thyromegaly.  Cardiovascular:     Rate and Rhythm: Normal rate and regular rhythm.     Heart sounds: No murmur heard.    No friction rub. No gallop.  Pulmonary:     Breath sounds: No stridor. No wheezing or rales.  Chest:     Chest wall: No tenderness.  Abdominal:  General: There is no distension.     Tenderness: There is abdominal tenderness. There is no rebound.  Musculoskeletal:        General: Normal range of motion.     Cervical back: Neck supple.  Lymphadenopathy:     Cervical: No cervical adenopathy.  Skin:    Findings: No erythema or rash.  Neurological:     Mental Status: She is alert and oriented to person, place, and time.     Motor: No abnormal muscle tone.     Coordination: Coordination normal.  Psychiatric:        Behavior: Behavior normal.     ED Results / Procedures / Treatments   Labs (all labs ordered are listed, but only abnormal results are displayed) Labs Reviewed  COMPREHENSIVE METABOLIC  PANEL - Abnormal; Notable for the following components:      Result Value   Glucose, Bld 158 (*)    BUN 25 (*)    Creatinine, Ser 1.05 (*)    Total Protein 8.2 (*)    GFR, Estimated 59 (*)    All other components within normal limits  CBC - Abnormal; Notable for the following components:   WBC 18.4 (*)    RBC 5.14 (*)    Hemoglobin 15.2 (*)    All other components within normal limits  URINALYSIS, ROUTINE W REFLEX MICROSCOPIC - Abnormal; Notable for the following components:   APPearance HAZY (*)    Ketones, ur 20 (*)    Leukocytes,Ua MODERATE (*)    Bacteria, UA RARE (*)    Non Squamous Epithelial 0-5 (*)    All other components within normal limits  LIPASE, BLOOD    EKG None  Radiology No results found.  Procedures Procedures    Medications Ordered in ED Medications  sodium chloride 0.9 % bolus 500 mL (0 mLs Intravenous Stopped 12/09/22 2246)  HYDROmorphone (DILAUDID) injection 0.5 mg (0.5 mg Intravenous Given 12/09/22 2135)  ondansetron (ZOFRAN) injection 4 mg (4 mg Intravenous Given 12/09/22 2135)    ED Course/ Medical Decision Making/ A&P    This patient presents to the ED for concern of abdominal pain, this involves an extensive number of treatment options, and is a complaint that carries with it a high risk of complications and morbidity.  The differential diagnosis includes appendicitis, UTI, diverticulitis   Co morbidities that complicate the patient evaluation  GERD and prolapsed bladder   Additional history obtained:  Additional history obtained from family External records from outside source obtained and reviewed including hospital records   Lab Tests:  I Ordered, and personally interpreted labs.  The pertinent results include: White blood count 18,000, urinalysis shows 6-10 white cells   Imaging Studies ordered: No imaging  Cardiac Monitoring: / EKG:  The patient was maintained on a cardiac monitor.  I personally viewed and interpreted the  cardiac monitored which showed an underlying rhythm of: Normal sinus rhythm   Consultations Obtained:  No consultant  Problem List / ED Course / Critical interventions / Medication management  Abdominal pain and bladder prolapse I ordered medication including Dilaudid for pain Reevaluation of the patient after these medicines showed that the patient improved I have reviewed the patients home medicines and have made adjustments as needed   Social Determinants of Health:  None   Test / Admission - Considered:  None    Patient CT scan from yesterday shows possible cystocele.  Patient also had a urine culture done yesterday and is on doxycycline for previous  bronchitis                           Medical Decision Making Amount and/or Complexity of Data Reviewed Labs: ordered.  Risk Prescription drug management.   Patient with lower abdominal pain and CT scan of the abdomen unremarkable yesterday except for cystocele.  She is to follow-up with the bladder specialist.  Patient is given some pain medicine nausea medicine        Final Clinical Impression(s) / ED Diagnoses Final diagnoses:  Lower abdominal pain    Rx / DC Orders ED Discharge Orders          Ordered    oxyCODONE-acetaminophen (PERCOCET) 5-325 MG tablet  Every 6 hours PRN        12/09/22 2342    ondansetron (ZOFRAN-ODT) 4 MG disintegrating tablet        12/09/22 2342              Milton Ferguson, MD 12/10/22 1753

## 2022-12-09 NOTE — ED Triage Notes (Signed)
Patient arrived with complaints of lower abdominal pain since Sunday. Hx of prolapsed bladder. Some complaints of nausea.

## 2022-12-09 NOTE — Discharge Instructions (Signed)
Return if any problems.  Follow-up with that doctor about your bladder as planned

## 2023-08-14 IMAGING — US US ABDOMEN LIMITED
1 series · 14 of 25 positions shown · non-contrast
Comparison: CT abdomen and pelvis 04/20/2022

CLINICAL DATA: Right upper quadrant pain.

EXAM:
ULTRASOUND ABDOMEN LIMITED RIGHT UPPER QUADRANT

[Series 1: us abdomen limited ruq (liver/gb) · 14 of 39 slices shown]
[im 1/39]
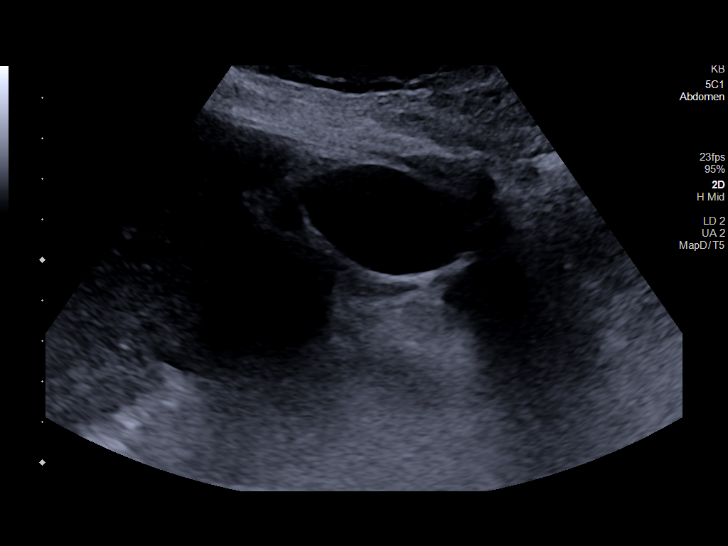
[im 4/39]
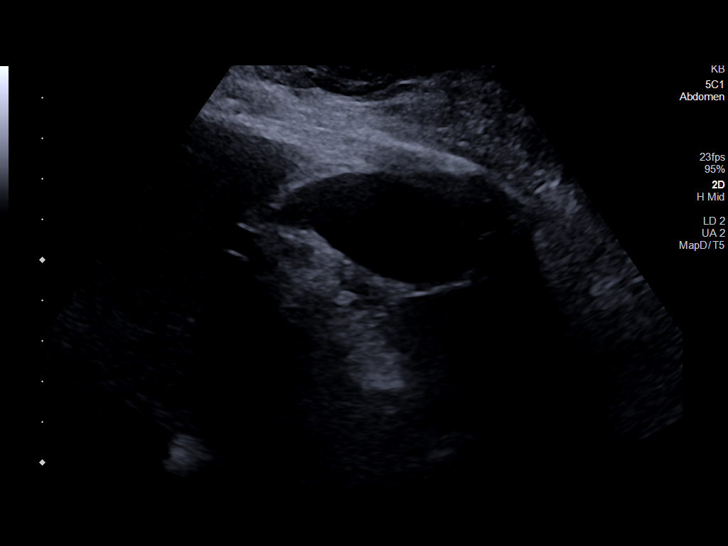
[im 7/39]
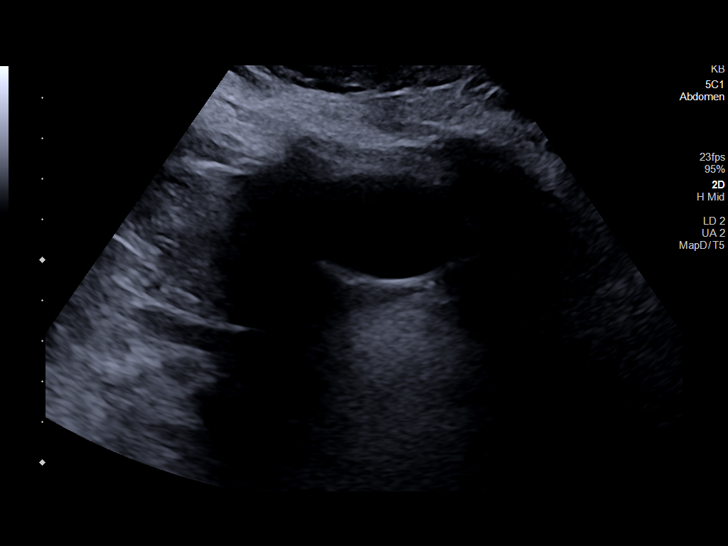
[im 10/39]
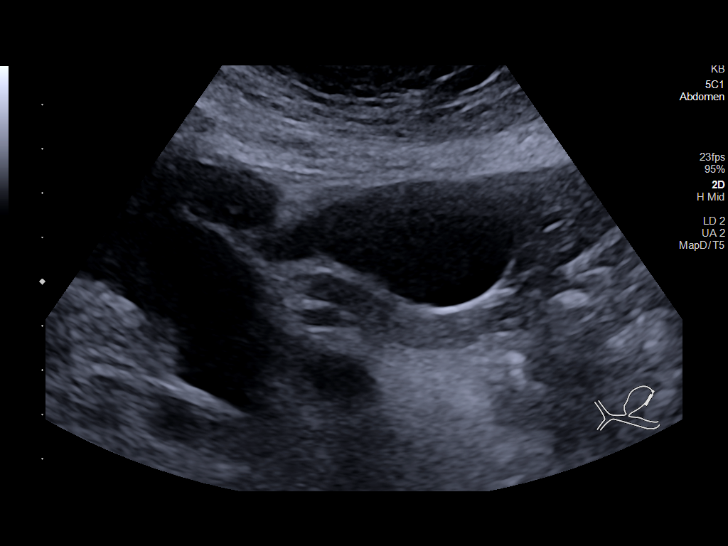
[im 13/39]
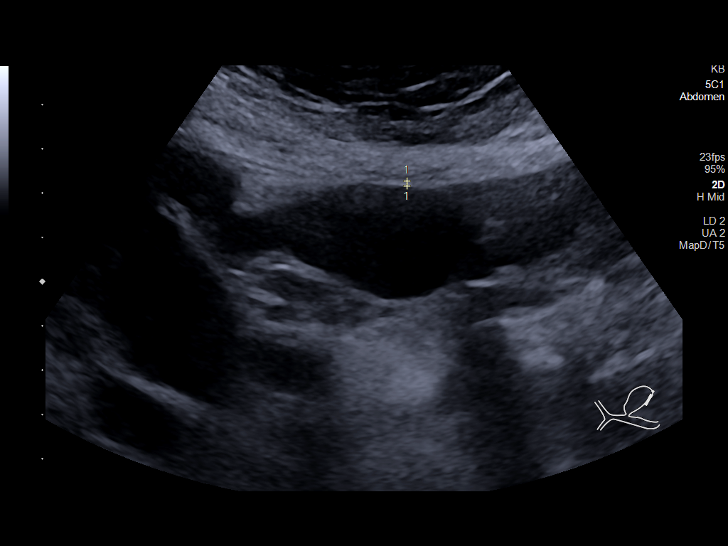
[im 15/39]
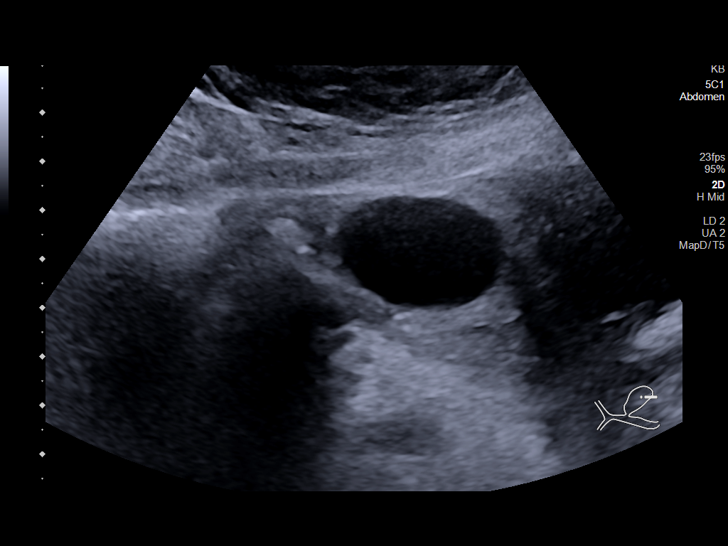
[im 18/39]
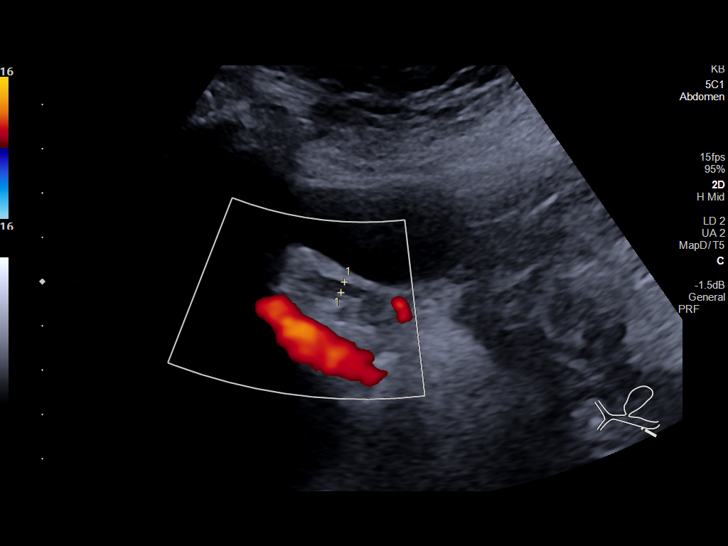
[im 21/39]
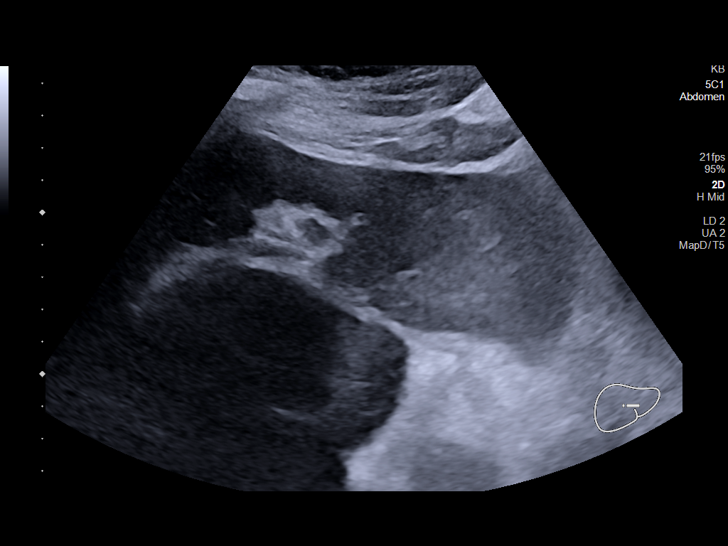
[im 24/39]
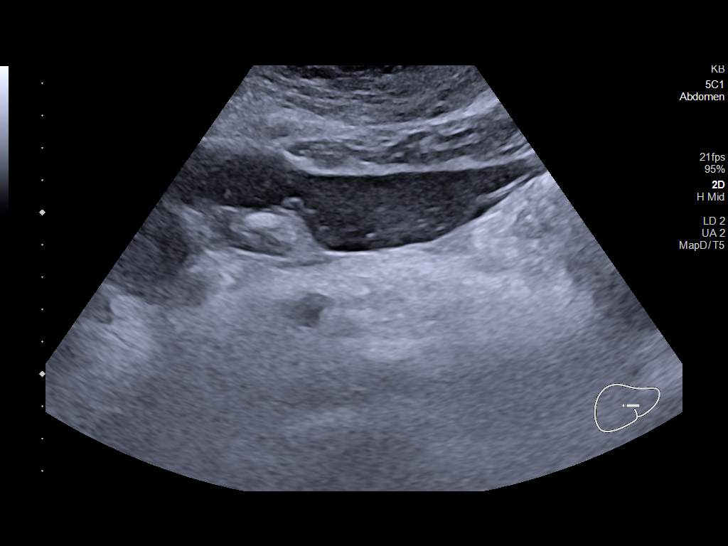
[im 26/39]
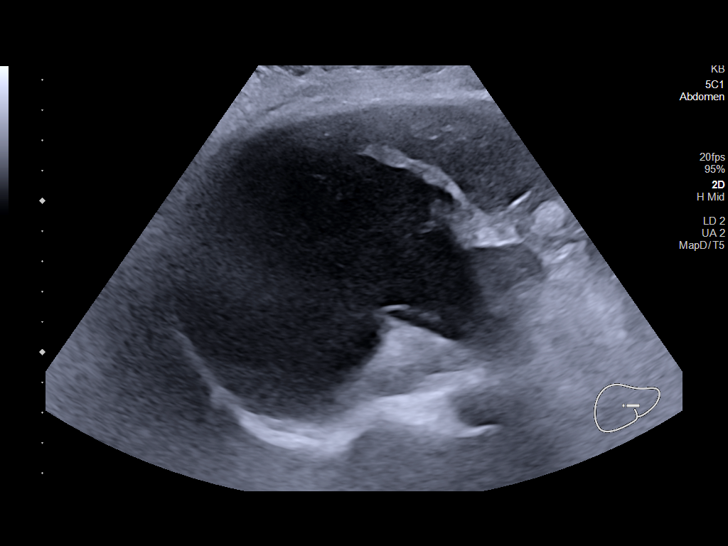
[im 29/39]
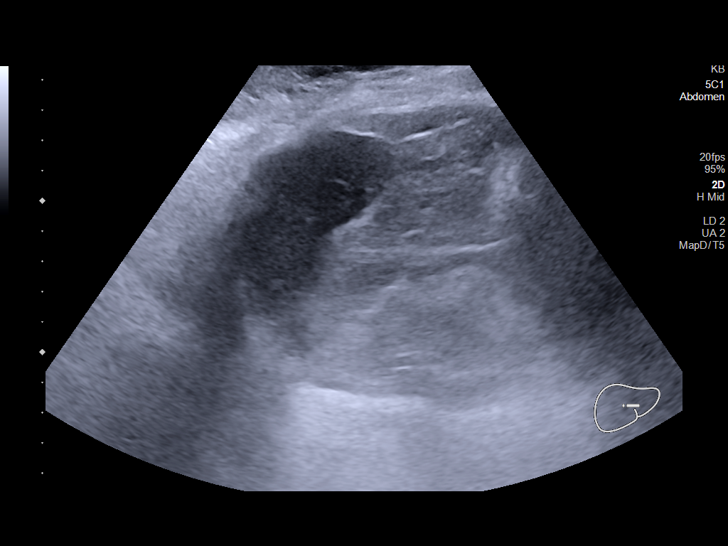
[im 32/39]
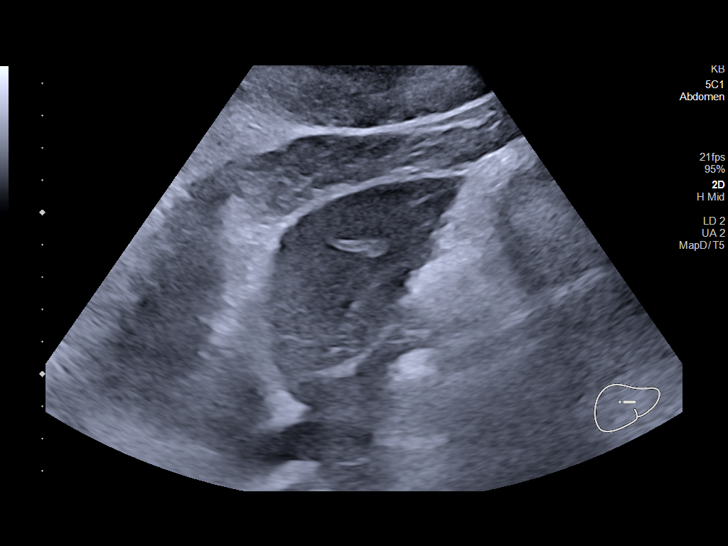
[im 35/39]
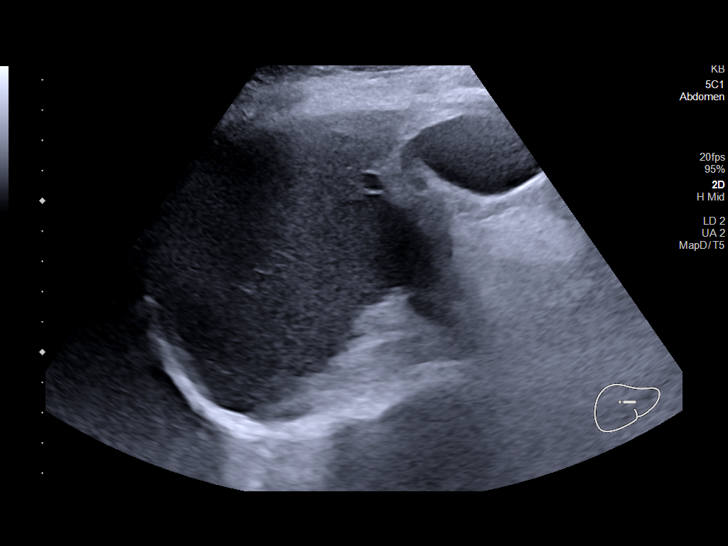
[im 39/39]
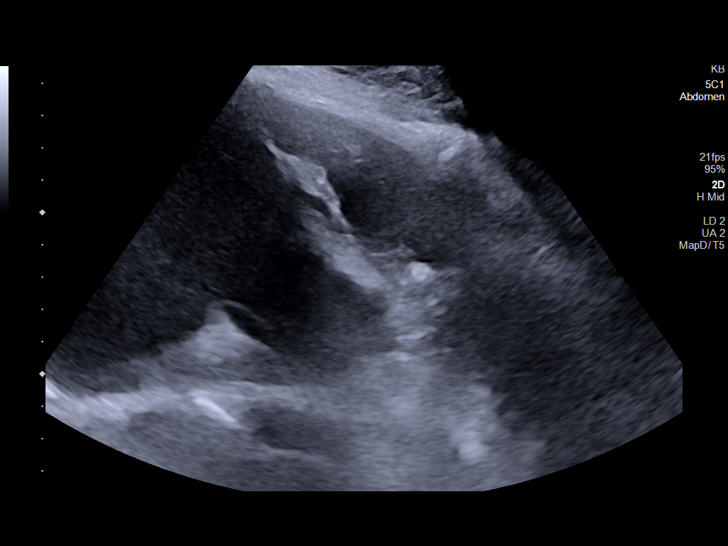

[14 of 25 positions shown; findings below may reference images not displayed]

FINDINGS: Gallbladder:

No gallstones or wall thickening visualized. No sonographic Murphy
sign noted by sonographer.

Common bile duct:

Diameter: 2.5 mm.

Liver:

No focal lesion identified. Within normal limits in parenchymal
echogenicity. Portal vein is patent on color Doppler imaging with
normal direction of blood flow towards the liver.

Other: None.
IMPRESSION: 1. No gallstones identified by ultrasound.  No acute abnormality.

## 2023-08-14 IMAGING — CT CT ABD-PELV W/ CM
2 of 5 series · 16 of 46 positions shown, 18 images · IV contrast (agent unspecified)
Comparison: Ultrasound abdomen 02/11/2022

CLINICAL DATA: Acute abdominal pain.

EXAM:
CT ABDOMEN AND PELVIS WITH CONTRAST
TECHNIQUE: Multidetector CT imaging of the abdomen and pelvis was performed
using the standard protocol following bolus administration of
intravenous contrast.

[Series 2: axial st · axial · 0.76mm/px · z∈[-390,-0]mm · 13 of 92 slices shown, 15 images]
[im 7/92  soft-tissue]
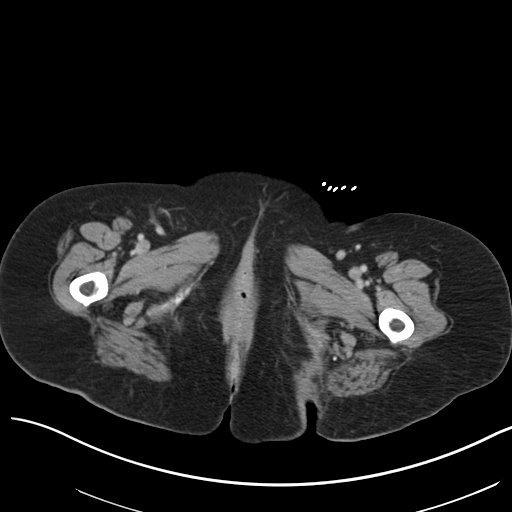
[im 7/92  bone]
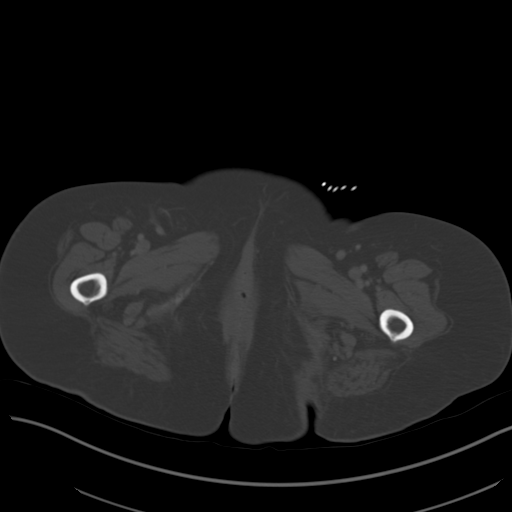
[im 14/92  soft-tissue]
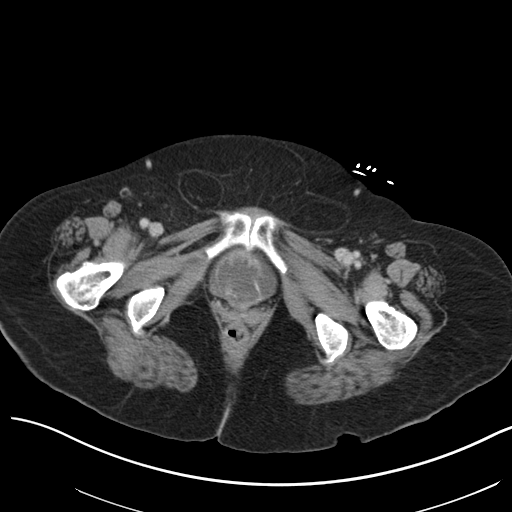
[im 20/92  soft-tissue]
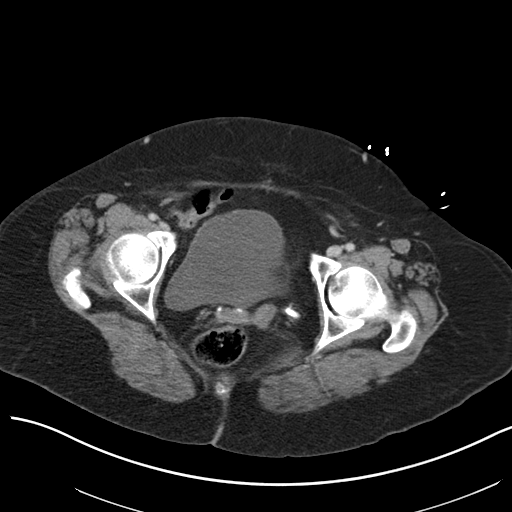
[im 27/92  soft-tissue]
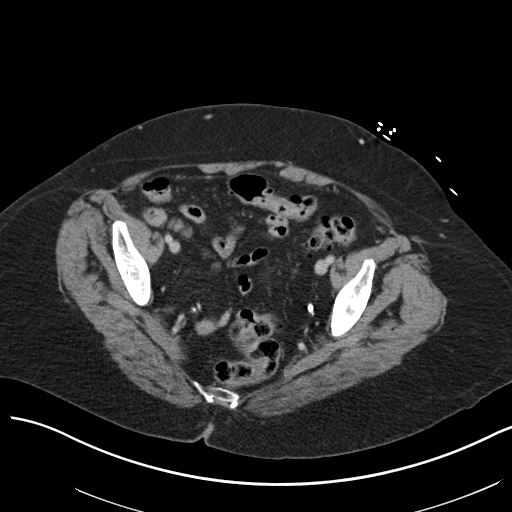
[im 33/92  soft-tissue]
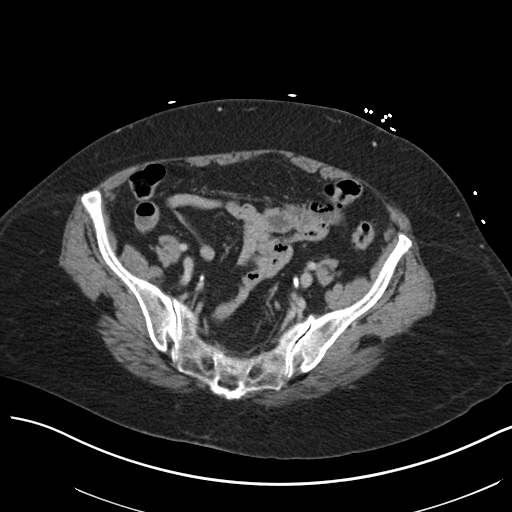
[im 40/92  soft-tissue]
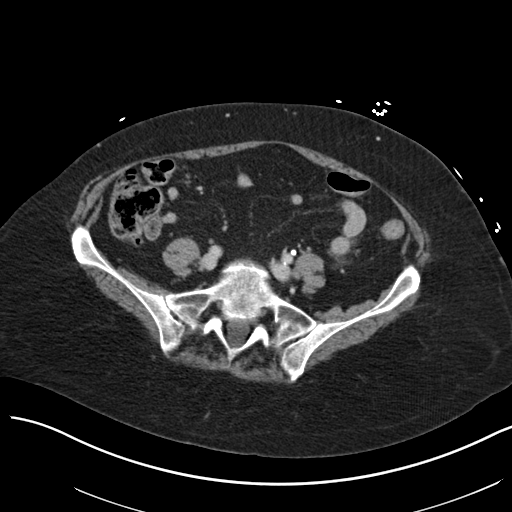
[im 46/92  soft-tissue]
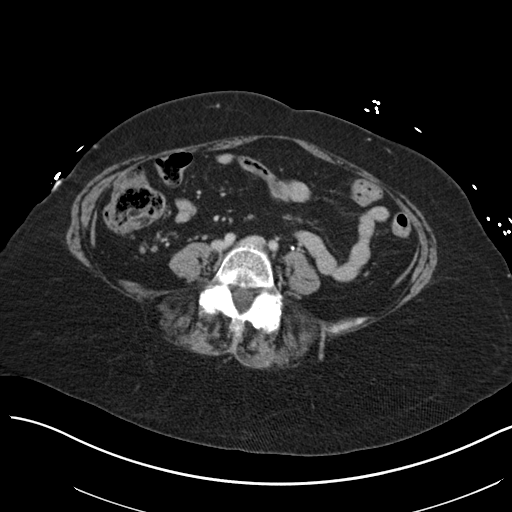
[im 53/92  soft-tissue]
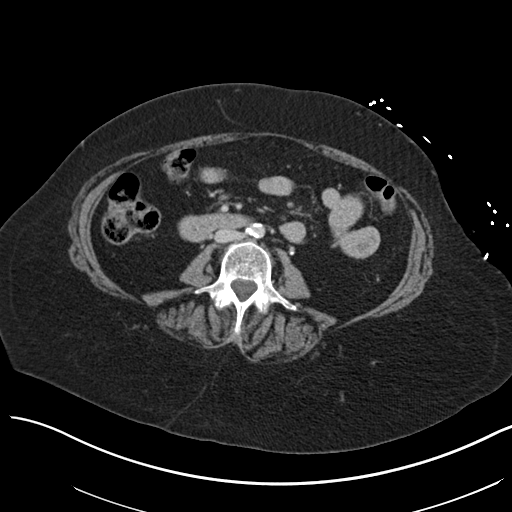
[im 59/92  soft-tissue]
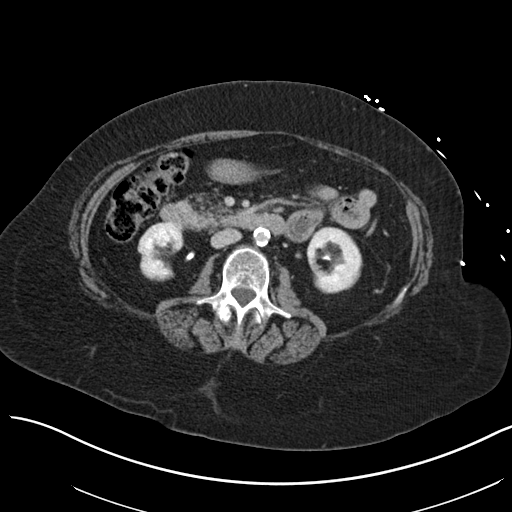
[im 59/92  bone]
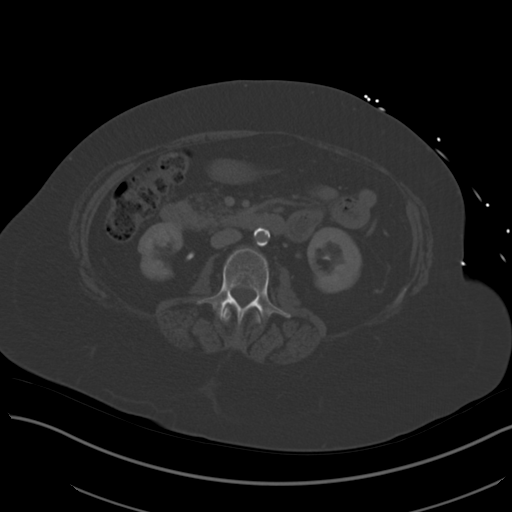
[im 66/92  soft-tissue]
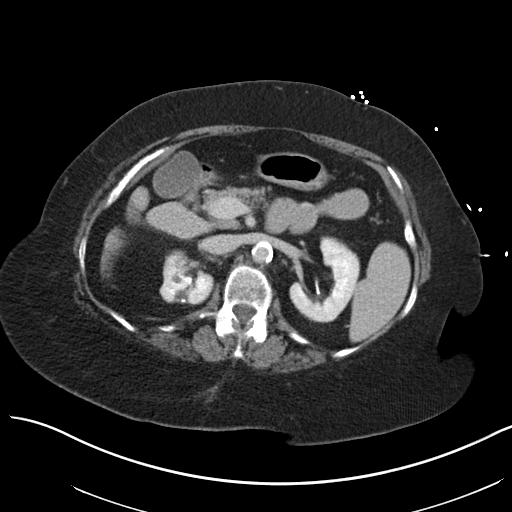
[im 72/92  soft-tissue]
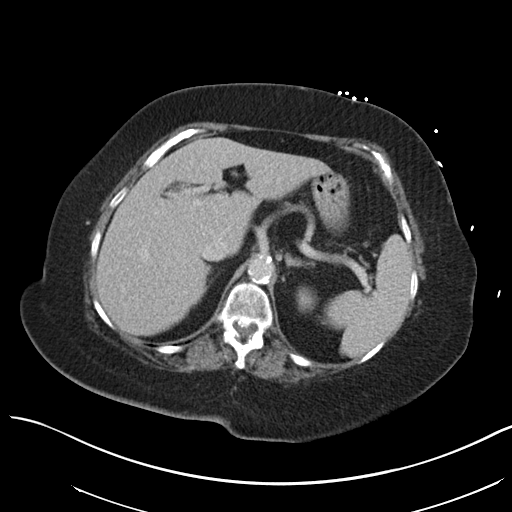
[im 79/92  soft-tissue]
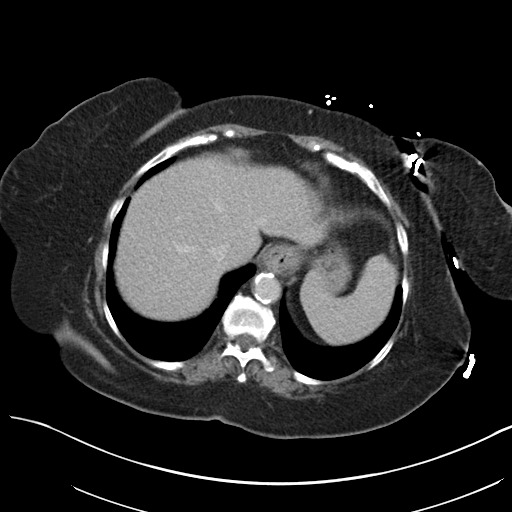
[im 85/92  soft-tissue]
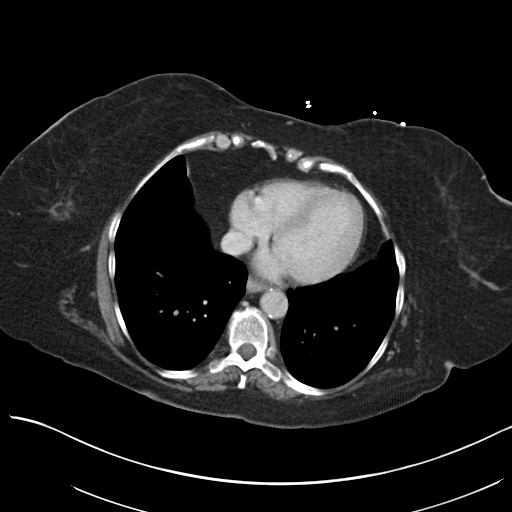

[Series 4: coronal st · coronal · 0.87mm/px · 3 of 134 slices shown]
[im 45/134  soft-tissue]
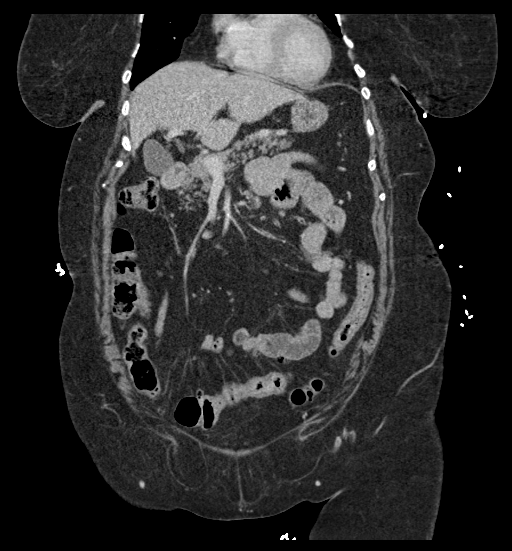
[im 60/134  soft-tissue]
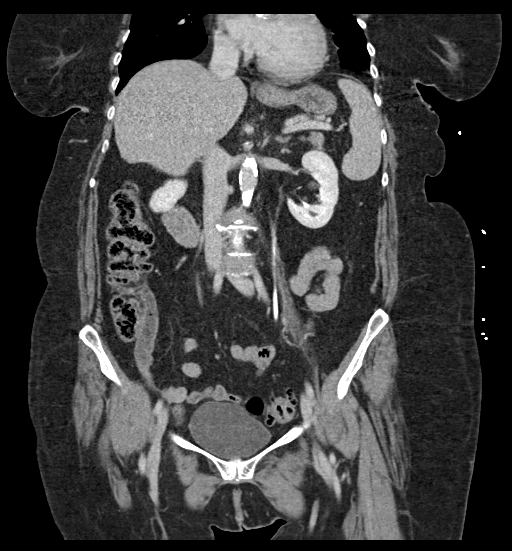
[im 74/134  soft-tissue]
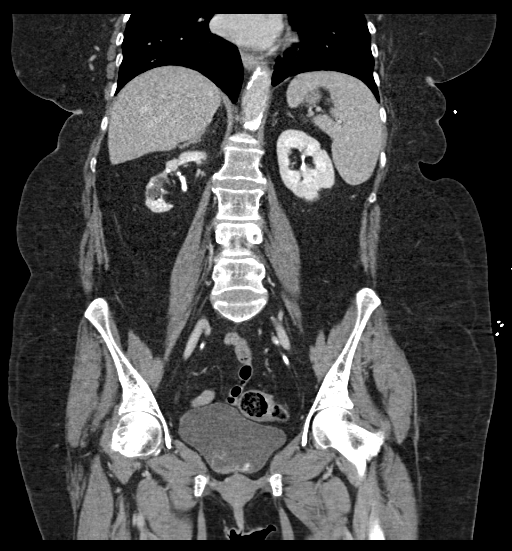

[16 of 46 positions shown; findings below may reference images not displayed]

RADIATION DOSE REDUCTION: This exam was performed according to the
departmental dose-optimization program which includes automated
exposure control, adjustment of the mA and/or kV according to
patient size and/or use of iterative reconstruction technique.

CONTRAST:  80mL OMNIPAQUE IOHEXOL 300 MG/ML  SOLN
FINDINGS: Lower chest: No acute abnormality.

Hepatobiliary: Small gallstones are present. There is no biliary
ductal dilatation. The liver is within normal limits.

Pancreas: Unremarkable. No pancreatic ductal dilatation or
surrounding inflammatory changes.

Spleen: Normal in size without focal abnormality.

Adrenals/Urinary Tract: There is no hydronephrosis or perinephric
fat stranding. There is marked scarring throughout the right kidney
with some renal atrophy. There are punctate calcifications
throughout the right kidney. Right renal cysts are present measuring
up 2 15 mm. Adrenal glands and bladder are within normal limits.

Stomach/Bowel: Stomach is within normal limits. Appendix is not
seen. No evidence of bowel wall thickening, distention, or
inflammatory changes. There is colonic diverticulosis.

Vascular/Lymphatic: Aortic atherosclerosis. No enlarged abdominal or
pelvic lymph nodes.

Reproductive: Status post hysterectomy. No adnexal masses.

Other: There are small fat containing inguinal and umbilical
hernias. There is no ascites.

Musculoskeletal: No acute or significant osseous findings.
IMPRESSION: 1. No acute localizing process in the abdomen or pelvis.
2. Cholelithiasis.
3. Nonobstructing right renal calculi.
4. Colonic diverticulosis.
5. Right renal cortical scarring.
6.  Aortic Atherosclerosis ([id number]).

## 2024-03-28 ENCOUNTER — Telehealth (HOSPITAL_COMMUNITY): Payer: Self-pay

## 2024-03-28 NOTE — Telephone Encounter (Signed)
 Called and spoke with pt and Henry Loge (pt daug). Pt did give verbal consent to talk with Henry Loge. Henry Loge stated pt is still in hospital from heart event on 5/12 and that she does not think pt insurance is in network with [facility]. Sonda Dural I will contact pt insurance from benefits.

## 2024-03-28 NOTE — Telephone Encounter (Signed)
Outside/paper referral received by Dr. Kincaid from [facility]. Will fax over Physician order and request further documents. Insurance benefits and eligibility to be determined.  

## 2024-03-29 ENCOUNTER — Telehealth (HOSPITAL_COMMUNITY): Payer: Self-pay

## 2024-03-29 NOTE — Telephone Encounter (Signed)
 Pt insurance is active and benefits verified through [employer] [place]. Per Elbert Greaves, [facility] is out of network with pt plan and does not have ant out of network benefits. Ref#[id number]     Called and spoke with pt daughter Henry Loge of above information.
